# Patient Record
Sex: Female | Born: 1970 | Race: White | Hispanic: No | Marital: Married | Smoking: Current every day smoker
Health system: Southern US, Community
[De-identification: ages and names within clinical notes are randomized; demographics above are authoritative.]

## PROBLEM LIST (undated history)

## (undated) DIAGNOSIS — R002 Palpitations: Secondary | ICD-10-CM

## (undated) HISTORY — PX: ABDOMINAL HYSTERECTOMY: SHX81

---

## 2004-09-10 ENCOUNTER — Emergency Department: Payer: Self-pay | Admitting: Emergency Medicine

## 2004-11-13 ENCOUNTER — Ambulatory Visit: Payer: Self-pay

## 2005-09-21 ENCOUNTER — Emergency Department: Payer: Self-pay | Admitting: Emergency Medicine

## 2005-09-22 ENCOUNTER — Other Ambulatory Visit: Payer: Self-pay

## 2005-09-22 ENCOUNTER — Emergency Department: Payer: Self-pay | Admitting: Emergency Medicine

## 2007-03-01 ENCOUNTER — Ambulatory Visit: Payer: Self-pay | Admitting: Internal Medicine

## 2007-06-02 ENCOUNTER — Emergency Department: Payer: Self-pay | Admitting: Emergency Medicine

## 2007-08-26 ENCOUNTER — Observation Stay: Payer: Self-pay | Admitting: Obstetrics & Gynecology

## 2007-11-20 ENCOUNTER — Observation Stay: Payer: Self-pay

## 2007-11-21 ENCOUNTER — Observation Stay: Payer: Self-pay

## 2007-12-10 ENCOUNTER — Observation Stay: Payer: Self-pay

## 2007-12-14 ENCOUNTER — Inpatient Hospital Stay: Payer: Self-pay

## 2008-01-07 ENCOUNTER — Ambulatory Visit: Payer: Self-pay

## 2008-10-23 ENCOUNTER — Ambulatory Visit: Payer: Self-pay | Admitting: Internal Medicine

## 2009-08-03 ENCOUNTER — Observation Stay: Payer: Self-pay | Admitting: Internal Medicine

## 2010-10-01 ENCOUNTER — Emergency Department: Payer: Self-pay | Admitting: Emergency Medicine

## 2011-02-19 ENCOUNTER — Ambulatory Visit: Payer: Self-pay

## 2011-02-21 ENCOUNTER — Emergency Department: Payer: Self-pay | Admitting: Emergency Medicine

## 2011-02-25 ENCOUNTER — Inpatient Hospital Stay: Payer: Self-pay

## 2011-02-26 LAB — PATHOLOGY REPORT

## 2011-07-19 ENCOUNTER — Inpatient Hospital Stay: Payer: Self-pay | Admitting: *Deleted

## 2011-07-19 LAB — TSH: Thyroid Stimulating Horm: 3.68 u[IU]/mL

## 2011-07-19 LAB — HEPATIC FUNCTION PANEL A (ARMC)
Alkaline Phosphatase: 51 U/L (ref 50–136)
Bilirubin,Total: 0.7 mg/dL (ref 0.2–1.0)
SGOT(AST): 25 U/L (ref 15–37)
SGPT (ALT): 26 U/L
Total Protein: 7.7 g/dL (ref 6.4–8.2)

## 2011-07-19 LAB — CK-MB: CK-MB: 1.9 ng/mL (ref 0.5–3.6)

## 2011-07-19 LAB — CBC WITH DIFFERENTIAL/PLATELET
Basophil #: 0 10*3/uL (ref 0.0–0.1)
Eosinophil %: 2.4 %
HCT: 47.1 % — ABNORMAL HIGH (ref 35.0–47.0)
Lymphocyte #: 2.1 10*3/uL (ref 1.0–3.6)
MCH: 30.1 pg (ref 26.0–34.0)
MCV: 89 fL (ref 80–100)
Monocyte #: 0.7 10*3/uL (ref 0.0–0.7)
Platelet: 295 10*3/uL (ref 150–440)
RBC: 5.32 10*6/uL — ABNORMAL HIGH (ref 3.80–5.20)
RDW: 13.4 % (ref 11.5–14.5)
WBC: 9.8 10*3/uL (ref 3.6–11.0)

## 2011-07-19 LAB — TROPONIN I: Troponin-I: <0.02 ng/mL

## 2011-07-19 LAB — BASIC METABOLIC PANEL
BUN: 24 mg/dL — ABNORMAL HIGH (ref 7–18)
Chloride: 106 mmol/L (ref 98–107)
Creatinine: 0.77 mg/dL (ref 0.60–1.30)
EGFR (African American): >60
Potassium: 4.2 mmol/L (ref 3.5–5.1)
Sodium: 139 mmol/L (ref 136–145)

## 2011-07-19 LAB — URINALYSIS, COMPLETE
Bilirubin,UR: NEGATIVE
Blood: NEGATIVE
Leukocyte Esterase: NEGATIVE
Nitrite: NEGATIVE
Protein: NEGATIVE
RBC,UR: NONE SEEN /HPF (ref 0–5)
Specific Gravity: 1.011 (ref 1.003–1.030)

## 2011-07-20 LAB — CBC WITH DIFFERENTIAL/PLATELET
Basophil %: 0.6 %
Eosinophil #: 0.2 10*3/uL (ref 0.0–0.7)
Eosinophil %: 2.9 %
HCT: 37.7 % (ref 35.0–47.0)
Lymphocyte #: 3.1 10*3/uL (ref 1.0–3.6)
MCH: 29.7 pg (ref 26.0–34.0)
MCHC: 33.4 g/dL (ref 32.0–36.0)
MCV: 89 fL (ref 80–100)
Monocyte #: 0.8 10*3/uL — ABNORMAL HIGH (ref 0.0–0.7)
Monocyte %: 9.5 %
Neutrophil #: 4.1 10*3/uL (ref 1.4–6.5)
Platelet: 263 10*3/uL (ref 150–440)
RBC: 4.25 10*6/uL (ref 3.80–5.20)

## 2011-07-20 LAB — BASIC METABOLIC PANEL
Anion Gap: 9 (ref 7–16)
BUN: 18 mg/dL (ref 7–18)
Chloride: 108 mmol/L — ABNORMAL HIGH (ref 98–107)
Co2: 28 mmol/L (ref 21–32)
Creatinine: 0.64 mg/dL (ref 0.60–1.30)
EGFR (African American): >60
EGFR (Non-African Amer.): >60
Osmolality: 291 (ref 275–301)

## 2011-07-20 LAB — TROPONIN I: Troponin-I: <0.02 ng/mL

## 2014-11-05 NOTE — H&P (Signed)
PATIENT NAME:  Cheyenne Hill, LIBBEY MR#:  [id number] DATE OF BIRTH:  Apr 14, 1971  DATE OF ADMISSION:  07/19/2011  PRIMARY CARE PHYSICIAN: None.   CHIEF COMPLAINT: Passed out, fluttering of the chest, dizziness, nausea.   HISTORY OF PRESENT ILLNESS: A 44 year old female who has history of supraventricular tachycardia in the past, history of arrhythmias. She was last admitted here in January 2011 with supraventricular tachycardia. She had required adenosine in the past. She has last seen Dr. Lady Gary for follow-up in February 2011 when she was started on metoprolol. Today she presented. Last night while she was sleeping she went to the bathroom and after she was coming back she just passed out. She felt a sudden cramping in her abdomen and then she passed out with her nose hitting the floor. She was extremely dizzy, nauseous. She denies any chest pain. She feels palpitations, some fluttering of the chest. She said this is the most severe episode that she had. It usually resolves in a hour but this is persistent so she was brought by EMS. She was in rapid atrial fibrillation. She presented with rapid atrial fibrillation with a rate of 145. She got about 25 mg of IV Cardizem so far but heart rate is still in 130s, 140s. She also got 0.5 mg of IV Ativan. She is still complaining of palpitations. She feels she is extremely dizzy. She feels as if someone has beaten her. She drinks about one diet [facility] every day in the morning. She denies a lot of caffeine intake except the [facility]. She denies any abdominal pain right now. She is complaining of headache and mainly complaining of she feels her nose is broken and mainly complaining of nose pain. She is being admitted for atrial fibrillation with RVR with syncope.   REVIEW OF SYSTEMS: She feels extremely fatigued and tired at this time. No acute change in vision. She is complaining of headache, nose pain. Denies any cough or dyspnea. No painful respiration. No chest  pain but complaining of palpitations. She had a syncopal episode. She is complaining of nausea. No abdominal pain. No GI bleed. No dysuria. No frequency. No thyroid problems. No anemia. No easy bruisability. No bleeding from any site. No rash. No joint pains. No swelling. No focal numbness or weakness. No anxiety, depression.   PAST MEDICAL HISTORY: She has history of supraventricular tachycardia in the past. She was last admitted in January 2011. She usually required Adenocard in the past for supraventricular tachycardia. She says that she had a similar episode that was a minor one in [id number] but did not require an ER visit or admission at that time. She was previously taking metoprolol prescribed by Dr. Lady Gary in 2011 but she is no longer taking any medications. She had an echo done in January 2011 which showed that her echo was normal, ejection fraction of greater than 55%, essentially normal echocardiogram.   PAST SURGICAL HISTORY: She had a hysterectomy recently.   HOME MEDICATIONS: None right now.   ALLERGIES TO MEDICATIONS: None.   SOCIAL HISTORY: She works two jobs. 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 She smokes about a pack would last a few days. Occasional alcohol use. No drug use.   FAMILY HISTORY: Her grandfather had myocardial infarction at the age of 31. Uncle had myocardial infarction at the age of 56, also history of hypertension. As per previous records father had cancer and hypertension.   PHYSICAL EXAMINATION:  VITAL SIGNS: Her vitals when  she presented to the Emergency Room: Temperature 98.1, heart rate 147, respiratory rate 18, blood pressure 100/70, saturating 98% on oxygen. Currently heart rate 144, blood pressure 112/61, saturating 96% on 2 liters nasal cannula.   GENERAL: This is a young Caucasian female, well built, she is in some distress because of nose pain right now.   HEENT: Extraocular muscles are intact. Pupils are equal. No scleral icterus. No  conjunctivitis. Oral mucosa is moist. No pallor. There is obvious deformity on the left side of the left nose.   NECK: No thyroid tenderness, enlargement or nodule. Neck is supple. No masses, nontender. No adenopathy. No JVD. No carotid bruit.   CHEST: Bilateral breath sounds are clear. No wheeze. Normal effort. No respiratory distress.   HEART: Heart sounds are irregular, irregular. No murmur. Good peripheral pulses. No lower extremity edema.   ABDOMEN: Soft, nontender. Normal bowel sounds. No hepatomegaly. No bruit. No masses.   RECTAL: Deferred.   NEUROLOGIC: She is awake, alert, oriented to time, place, and person, slightly lethargic. Cranial nerves are intact. Moving all extremities against gravity.   EXTREMITIES: No cyanosis. No clubbing.   SKIN: No rash, no lesions except the nasal deformity.   LABORATORY, DIAGNOSTIC, AND RADIOLOGICAL DATA: White count 9.8, hemoglobin 16, platelet count 95,000. BMP: Sodium 139, potassium 4.2, BUN 24, creatinine 0.77. Troponin is negative. CK-MB 1.9. LFTs are normal. TSH 3.68. Her CT of the head for syncope basically negative. It says paranasal sinuses and mastoid air cells are negative.   IMPRESSION:  1. Atrial fibrillation with rapid ventricular response.  2. Syncope most likely secondary to rapid atrial fibrillation.  3. History of supraventricular tachycardia in the past.  4. Tobacco abuse.   PLAN: A 44 year old female smoker smoking a pack would last about a couple of days. She presents with syncope with some nasal deformity. She is complaining of palpitations, dizziness, and she is found to be in rapid atrial fibrillation. When she came in her heart rate was like 145, her EKG shows atrial fibrillation. Previously she had SVT but this time she had atrial fibrillation. No acute ischemic changes. She got about 25 mg of IV Cardizem so far. I am going to give her more 10 mg of IV Cardizem and start her on p.o. Cardizem 60 mg q 6 hrs. Her TSH is  normal at this time. Will repeat an echocardiogram, do serial cardiac enzymes on her. Get a cardiology consult on her also. I will also start her on some Lovenox. If her atrial fibrillation remains persistent then she may need Coumadin but her CHADS2 score is low. We can also try beta blocker on her if her heart rate remains high. Will also give her some IV hydration. Will try to talk to the radiologist if the CT scan of the head covered nasolabial also if there is any obvious fracture. Will start her on Norco p.r.n. for nose pain. Plan was discussed with the patient and husband in detail.   TIME SPENT WITH ADMISSION AND COORDINATION: Around 60 minutes.   ____________________________ Fredia SorrowAbhinav Arlene Genova, MD ag:cms D: 07/19/2011 [id number]:10:[id number] ET T: 07/19/2011 10:01:02 ET  JOB#: [id number] cc: Fredia SorrowAbhinav Liviah Cake, MD, <Dictator> Fredia SorrowABHINAV Prashant Glosser MD ELECTRONICALLY SIGNED 08/01/2011 12:29

## 2014-11-05 NOTE — Consult Note (Signed)
    General Aspect 44 yo female with history of intermitant afib who was admitted after suffering a syncopal episode. She has bouts of afib approximately every 2 years. SHe has done well recently and has been off of all meds. SHe states she was in her usual state of health. She got up to use the bathroom at night and while returning to her bed, sufferred a severe cramp in her loer abdomen followed by a syncopal episode. She struck her face. Head ct revealed no intercranial events and no fractures. She was noted to be in afib with rvr on arrival to the er. She converted to sinus rhythm with cardizem and remains in sinus rhythm. She has ruled out fro an mi and ekg is now normal. Echo was normal with no signficant abnormality.   Physical Exam:   GEN WD, WN, NAD    HEENT PERRL    NECK supple    RESP clear BS    CARD Regular rate and rhythm  Murmur    Murmur Systolic    Systolic Murmur axilla    ABD denies tenderness  normal BS  no Abdominal Bruits  no Adominal Mass    LYMPH negative neck    EXTR negative cyanosis/clubbing, negative edema    SKIN normal to palpation    NEURO cranial nerves intact, motor/sensory function intact    PSYCH A+O to time, place, person   Review of Systems:   Subjective/Chief Complaint syncope    General: No Complaints    Skin: No Complaints    ENT: No Complaints    Eyes: No Complaints    Neck: No Complaints    Respiratory: No Complaints    Cardiovascular: No Complaints    Gastrointestinal: No Complaints    Genitourinary: No Complaints    Vascular: No Complaints    Musculoskeletal: Muscle or joint pain    Neurologic: Fainting    Hematologic: No Complaints    Endocrine: No Complaints    Psychiatric: No Complaints    Review of Systems: All other systems were reviewed and found to be negative    Medications/Allergies Reviewed Medications/Allergies reviewed   Home Medications:  alprazolam 0.25 mg oral tablet: 1 tab(s) orally every 8  hours, As Needed for stress/ anxiety, Active  EKG:   Interpretation afib with rvr concerted to sinus rhythm    No Known Allergies:     Impression 44 yo female with history of intermitant afib now admitted after a syncopal episode that occurred in the face of a severe abndominal cramp. She was in afib with rvr on arrival but has converted to sinus rhythm. Etiology of syncope is likely vaso vagal. Afib with rvr may also have played a role but the patient is usually symptomatic when she has the afib and felt none of it prior to the event.    Plan 1. Place on cardizem cd 120 mg daily 2. Ambulate and if stable consider discharge to home with holter monitor for 24 hours. 3. Follow up with Dr. Lady GaryFath in one week.   Electronic Signatures: Dalia HeadingFath, Sharline Lehane A (MD)  (Signed 06-Jan-13 12:37)  Authored: General Aspect/Present Illness, History and Physical Exam, Review of System, Home Medications, EKG , Allergies, Impression/Plan   Last Updated: 06-Jan-13 12:37 by Dalia HeadingFath, Leobardo Granlund A (MD)

## 2014-11-05 NOTE — Discharge Summary (Signed)
PATIENT NAME:  Cheyenne Hill, Cheyenne Hill MR#:  [id number] DATE OF BIRTH:  1971-04-13  DATE OF ADMISSION:  07/19/2011 DATE OF DISCHARGE:  07/20/2011  ADMITTING PHYSICIAN:  Fredia Sorrow, MD  DISCHARGING PHYSICIAN: Enid Baas, MD,  PRIMARY CARE PHYSICIAN: None.   CONSULTANTS IN THE HOSPITAL: Cardiology consultation by Dr. Lady Gary.  DISCHARGE DIAGNOSES: 1. Syncope and dizziness. 2. Atrial fibrillation with rapid ventricular rate. 3. History of paroxysmal atrial fibrillation and supraventricular tachycardia. 4. Tobacco use disorder. 5. Anxiety.  DISCHARGE MEDICATIONS: 1. Aspirin 81 mg p.o. daily. 2. Cardizem 240 mg p.o. daily. 3. Xanax 0.25 mg every eight hours as needed  DISCHARGE DIET: Low-sodium diet.Marland Kitchen   DISCHARGE ACTIVITY: As tolerated.    FOLLOWUP INSTRUCTIONS:   1. Advised to follow up with Dr. Lady Gary in 1 week. 2. PCP followup in 2 weeks. 3. The patient was discharged on a Holter monitor.  4. Smoking cessation.  LABS AT TIME OF DISCHARGE: 1. WBC 8.3, hemoglobin 12.6, hematocrit 37.7, platelet count 263,000. 2. Sodium 147, potassium 3.9, chloride 108, bicarbonate 18, BUN 8, creatinine 0.64, glucose 114, and calcium 7.8. Troponin 3 sets have remained negative in the hospital. 3. Urinalysis is negative.  4. Chest x-ray showing clear lung fields. No acute cardiopulmonary disease. 5. CT of the head showing no acute intracranial abnormality. No hemorrhage, focal mass or mass effect or midline shift. 6. ALT 26, AST 24, alkaline phosphatase 51, total bilirubin 0.7, albumin 4.5. TSH 3.68.  7. Echo Doppler showing LV systolic function is normal. Mild tricuspid regurgitation, right  ventricle is grossly normal in size. Left ventricular ejection fraction is 55%. No valvular abnormalities noted. Right atrium is mildly dilated.   BRIEF HOSPITAL COURSE: Cheyenne Hill is a 44 year old female with a known history of paroxysmal arrhythmias/atrial fibrillation/supraventricular tachycardia, according  to the patient at prior to admission almost greater than a year ago comes to the hospital after she had a syncopal episode. The patient states whenever she goes into this arrhythmic rhythm, she could feel a few days prior but she never had a syncopal episode in the past before. When  she came into the ER, she was in atrial fibrillation with rapid rate in 140s. She was given of 25 mg of IV Cardizem and then was started on p.o. Cardizem. She was admitted to telemetry. After Cardizem IV push she converted into normal sinus rhythm. She was discharged on metoprolol more than a year ago and was discussed to about other treatments including ablation. She took metoprolol for almost six months and then stopped and did okay off the medications. She does smoke but denies lot of caffeine intake. She has not had further syncopal episodes while in the hospital and she converted back to normal sinus rhythm. Echo looked okay with normal ejection fraction. No left atrial dilatation. Her CHADs score is very low so she was only started on aspirin and also she converted back to normal rhythm anyway. Seen by cardiologist Dr. Lady Gary who advised Holter  monitor at the time of discharge. The patient is being discharged with a 24-hour Holter monitor and we will return  in to see Dr. Lady Gary in the office. Her stay has been otherwise uneventful in the hospital.   DISCHARGE CONDITION: Stable.   DISCHARGE DISPOSITION: Home.         Time Spent ON discharge: 40 minutes.    ____________________________ Enid Baas, MD rk:ljs D: 07/21/2011 07:06:47 ET T: 07/22/2011 12:45:00 ET JOB#: [id number]  cc: Enid Baas, MD, <Dictator> Cheyenne Hill. Fath,  MD Enid BaasADHIKA Kayde Warehime MD ELECTRONICALLY SIGNED 07/28/2011 11:56

## 2015-10-29 ENCOUNTER — Emergency Department
Admission: EM | Admit: 2015-10-29 | Discharge: 2015-10-29 | Disposition: A | Discharge status: Home / Self Care | Payer: Self-pay | Attending: Student | Admitting: Student

## 2015-10-29 ENCOUNTER — Emergency Department: Payer: Self-pay

## 2015-10-29 ENCOUNTER — Encounter: Payer: Self-pay | Admitting: Emergency Medicine

## 2015-10-29 DIAGNOSIS — J209 Acute bronchitis, unspecified: Secondary | ICD-10-CM | POA: Insufficient documentation

## 2015-10-29 DIAGNOSIS — R079 Chest pain, unspecified: Secondary | ICD-10-CM

## 2015-10-29 DIAGNOSIS — F17200 Nicotine dependence, unspecified, uncomplicated: Secondary | ICD-10-CM | POA: Insufficient documentation

## 2015-10-29 DIAGNOSIS — J40 Bronchitis, not specified as acute or chronic: Secondary | ICD-10-CM

## 2015-10-29 HISTORY — DX: Palpitations: R00.2

## 2015-10-29 LAB — CBC WITH DIFFERENTIAL/PLATELET
BASOS PCT: 0 %
Basophils Absolute: 0 10*3/uL (ref 0–0.1)
EOS ABS: 0.2 10*3/uL (ref 0–0.7)
Eosinophils Relative: 2 %
HEMATOCRIT: 42.3 % (ref 35.0–47.0)
Hemoglobin: 14.4 g/dL (ref 12.0–16.0)
LYMPHS ABS: 1.8 10*3/uL (ref 1.0–3.6)
Lymphocytes Relative: 14 %
MCH: 29.3 pg (ref 26.0–34.0)
MCHC: 34 g/dL (ref 32.0–36.0)
MCV: 86.2 fL (ref 80.0–100.0)
MONOS PCT: 6 %
Monocytes Absolute: 0.8 10*3/uL (ref 0.2–0.9)
NEUTROS ABS: 10.2 10*3/uL — AB (ref 1.4–6.5)
NEUTROS PCT: 78 %
Platelets: 255 10*3/uL (ref 150–440)
RBC: 4.9 MIL/uL (ref 3.80–5.20)
RDW: 13.3 % (ref 11.5–14.5)
WBC: 13.1 10*3/uL — AB (ref 3.6–11.0)

## 2015-10-29 LAB — TROPONIN I
TROPONIN I: 0.03 ng/mL (ref <0.031)
Troponin I: <0.03 ng/mL (ref <0.031)

## 2015-10-29 LAB — BASIC METABOLIC PANEL
ANION GAP: 8 (ref 5–15)
BUN: 19 mg/dL (ref 6–20)
CALCIUM: 8.9 mg/dL (ref 8.9–10.3)
CO2: 20 mmol/L — AB (ref 22–32)
CREATININE: 0.65 mg/dL (ref 0.44–1.00)
Chloride: 110 mmol/L (ref 101–111)
GFR calc Af Amer: >60 mL/min (ref >60)
GFR calc non Af Amer: >60 mL/min (ref >60)
GLUCOSE: 114 mg/dL — AB (ref 65–99)
Potassium: 3.1 mmol/L — ABNORMAL LOW (ref 3.5–5.1)
Sodium: 138 mmol/L (ref 135–145)

## 2015-10-29 MED ORDER — OXYCODONE HCL 5 MG PO TABS
5.0000 mg | ORAL_TABLET | Freq: Once | ORAL | Status: AC
  Administered 2015-10-29: 5 mg via ORAL
  Filled 2015-10-29: qty 1

## 2015-10-29 MED ORDER — ALBUTEROL SULFATE HFA 108 (90 BASE) MCG/ACT IN AERS: 2.0000 | INHALATION_SPRAY | Freq: Four times a day (QID) | RESPIRATORY_TRACT | Status: DC | PRN

## 2015-10-29 MED ORDER — IBUPROFEN 600 MG PO TABS
600.0000 mg | ORAL_TABLET | Freq: Once | ORAL | Status: AC
  Administered 2015-10-29: 600 mg via ORAL
  Filled 2015-10-29: qty 1

## 2015-10-29 NOTE — ED Provider Notes (Addendum)
[facility] Emergency Department Provider Note  ____________________________________________  Time seen: Approximately 9:05 AM  I have reviewed the triage vital signs and the nursing notes.   HISTORY  Chief Complaint Chest Pain    HPI Cheyenne Hill is a 45 y.o. female history of paroxysmal atrial fibrillation and SVT who presents for evaluation of chest pain this morning which began at approximately 7:30 AM while she was driving her car, gradual onset, significantly improved at this time, no modifying factors. The patient reports that she was driving her kids to school this morning when she felt sudden pain in her sternal chest area. She describes it as "somebody twisting my heart". She reports that she also had some right arm pain and was feeling short of breath. She was concerned that she might have been in SVT or rapid atrial fibrillation again though she does not know how fast her heart rate was. She reports her symptoms spontaneously improved significantly on arrival to the emergency department. She has had productive cough for several weeks though she denies any hemoptysis or fever. She has had runny nose and nasal congestion as well as intermittent sore throat. No abdominal pain, vomiting or diarrhea. Pain was not ripping and tearing in nature, did not radiate to the back or down towards the feet. She denies any personal history of PE or DVT, no recent surgeries or prolonged period of immobilization, no exogenous estrogen use. She denies any personal history of ACS. She does report that she had an uncle who died of a heart attack when he was "like 35".   Past Medical History  Diagnosis Date  . Palpitations     There are no active problems to display for this patient.   History reviewed. No pertinent past surgical history.  No current outpatient prescriptions on file.  Allergies Review of patient's allergies indicates no known allergies.  History  reviewed. No pertinent family history.  Social History Social History  Substance Use Topics  . Smoking status: Current Every Day Smoker  . Smokeless tobacco: None  . Alcohol Use: No    Review of Systems Constitutional: No fever/chills Eyes: No visual changes. ENT: No sore throat. Cardiovascular: +chest pain. Respiratory: +shortness of breath. Gastrointestinal: No abdominal pain.  No nausea, no vomiting.  No diarrhea.  No constipation. Genitourinary: Negative for dysuria. Musculoskeletal: Negative for back pain. Skin: Negative for rash. Neurological: Negative for headaches, focal weakness or numbness.  10-point ROS otherwise negative.  ____________________________________________   PHYSICAL EXAM:  VITAL SIGNS: ED Triage Vitals  Enc Vitals Group     BP 10/29/15 0823 117/73 mmHg     Pulse Rate 10/29/15 0823 79     Resp 10/29/15 0834 12     Temp 10/29/15 0823 98 F (36.7 C)     Temp Source 10/29/15 0823 Oral     SpO2 10/29/15 0823 97 %     Weight 10/29/15 0823 230 lb (104.327 kg)     Height 10/29/15 0823 5\' 10"  (1.778 m)     Head Cir --      Peak Flow --      Pain Score 10/29/15 0824 10     Pain Loc --      Pain Edu? --      Excl. in GC? --     Constitutional: Alert and oriented. Well appearing and in no acute distress. Eyes: Conjunctivae are normal. PERRL. EOMI. Head: Atraumatic. Nose: + congestion/rhinnorhea. Mouth/Throat: Mucous membranes are moist.  Oropharynx non-erythematous.  Neck: No stridor.  Supple without meningismus. Cardiovascular: Normal rate, regular rhythm. Grossly normal heart sounds.  Good peripheral circulation. Respiratory: Normal respiratory effort.  No retractions. Lungs CTAB. Gastrointestinal: Soft and nontender. No distention.  No CVA tenderness. Genitourinary: deferred Musculoskeletal: No lower extremity tenderness nor edema.  No joint effusions. Tenderness to palpation throughout the chest wall bilaterally and palpation, worse to the  left and the right of the sternum. No calf swelling/asymmetry/tenderness. Neurologic:  Normal speech and language. No gross focal neurologic deficits are appreciated. No gait instability. Skin:  Skin is warm, dry and intact. No rash noted. Psychiatric: Mood and affect are normal. Speech and behavior are normal.  ____________________________________________   LABS (all labs ordered are listed, but only abnormal results are displayed)  Labs Reviewed  CBC WITH DIFFERENTIAL/PLATELET - Abnormal; Notable for the following:    WBC 13.1 (*)    Neutro Abs 10.2 (*)    All other components within normal limits  BASIC METABOLIC PANEL - Abnormal; Notable for the following:    Potassium 3.1 (*)    CO2 20 (*)    Glucose, Bld 114 (*)    All other components within normal limits  TROPONIN I  TROPONIN I   ____________________________________________  EKG  ED ECG REPORT I, Gayla Doss, the attending physician, personally viewed and interpreted this ECG.   Date: 10/29/2015  EKG Time: 08:08  Rate: 85  Rhythm: normal sinus rhythm  Axis: normal  Intervals:none  ST&T Change: No acute ST elevation.  ____________________________________________  RADIOLOGY  CXR IMPRESSION: Negative chest. ____________________________________________   PROCEDURES  Procedure(s) performed: None  Critical Care performed: No  ____________________________________________   INITIAL IMPRESSION / ASSESSMENT AND PLAN / ED COURSE  Pertinent labs & imaging results that were available during my care of the patient were reviewed by me and considered in my medical decision making (see chart for details).  Cheyenne Hill is a 45 y.o. female history of paroxysmal atrial fibrillation and SVT who presents for evaluation of chest pain this morning which began at approximately 7:30 AM while she was driving her car. On exam, she is very well-appearing and in no acute distress. Vital signs are stable, she is  afebrile. EKG reassuring, not consistent with acute ischemia. She does have reproducible tenderness to palpation throughout the chest wall. EKG shows normal sinus rhythm, no A. fib or SVT. PERC negative, doubt PE. Not consistent with acute aortic dissection. We'll obtain 2 sets of cardiac enzymes given her history of early coronary artery disease in an uncle but I suspect that her pain today may be muscular skeletal in nature and related to bronchitis. We'll treat her pain. Reassess for disposition. Observe briefly on cardiac monitor.  ----------------------------------------- 12:18 PM on 10/29/2015 ----------------------------------------- Patient was significant amount of her pain at this time. She is observed on cardiac monitor, remained in normal sinus rhythm after 4 hours of observation. 2 sets of cardiac enzymes are negative, doubt ACS. CBC with leukocytosis, white blood cell count is 13.1. BMP with mild hypokalemia, potassium 3.1. Chest x-ray clear. Discussed that given her prolonged cough, she may have musculoskeletal pain/rib strain related to bronchitis. We'll discharge with an albuterol MDI though I discussed with her that it's possible she may have had a run of SVT or A. fib that resolved spontaneously. She will follow-up with cardiology per our discussion. We discussed return precautions and she is comfortable with the discharge plan. DC home. ____________________________________________   FINAL CLINICAL IMPRESSION(S) / ED DIAGNOSES  Final diagnoses:  Chest pain, unspecified chest pain type  Bronchitis      Gayla Doss, MD 10/29/15 1610  Gayla Doss, MD 10/29/15 1219

## 2015-10-29 NOTE — ED Notes (Signed)
Pt sleeping in bed, call bell in reach

## 2015-10-29 NOTE — ED Notes (Signed)
Pt with chest pain, palpatations that started about 0730. Pain was pressure left of mid chest that radiated to right shoulder.

## 2016-02-20 ENCOUNTER — Ambulatory Visit: Payer: Self-pay

## 2019-12-13 ENCOUNTER — Ambulatory Visit
Admission: RE | Admit: 2019-12-13 | Discharge: 2019-12-13 | Disposition: A | Discharge status: Home / Self Care | Payer: Self-pay | Source: Ambulatory Visit | Attending: Oncology | Admitting: Oncology

## 2019-12-13 ENCOUNTER — Ambulatory Visit: Payer: Self-pay | Attending: Oncology

## 2019-12-13 ENCOUNTER — Other Ambulatory Visit: Payer: Self-pay

## 2019-12-13 VITALS — BP 120/83 | HR 74 | Temp 98.1°F | Ht 68.0 in | Wt 215.7 lb

## 2019-12-13 DIAGNOSIS — N63 Unspecified lump in unspecified breast: Secondary | ICD-10-CM

## 2019-12-13 DIAGNOSIS — Z0000 Encounter for general adult medical examination without abnormal findings: Secondary | ICD-10-CM | POA: Insufficient documentation

## 2019-12-13 NOTE — Progress Notes (Unsigned)
Radiologist reviewed Birads 2 findings with patient. Pap results pending. Phoned patient with Negative/negative pap results.  Copy to [facility].

## 2019-12-13 NOTE — Progress Notes (Signed)
°  Subjective:     Patient ID: Cheyenne Hill, female   DOB: 05/26/1971, 49 y.o.   MRN: [id number]  HPI   Review of Systems     Objective:   Physical Exam Chest:     Breasts:        Right: No swelling, bleeding, inverted nipple, mass, nipple discharge, skin change or tenderness.        Left: Mass present. No swelling, bleeding, inverted nipple, nipple discharge, skin change or tenderness.       Comments:   Left breast mobile mass, palpable when patient sitting up       Assessment:     49 year old patient presents for [facility] clinic visit. No previous mammogram.  Complains of left breast mass x 1 month.  Reports 40 lb. weight loss on [place]. Patient screened, and meets BCCCP eligibility.  Patient does not have insurance, Medicare or Medicaid. Instructed patient on breast self awareness using teach back method.  Palpated mass upper inner chest left breast.  Unable to palpate when patient is supine. Patient reports she noticed about a month ago.  Risk Assessment    Risk Scores      12/13/2019   Last edited by: Alta Corning, CMA   5-year risk: 1 %   Lifetime risk: 10.2 %            Plan:     Sent for bilateral diagnostic mammogram, and ultrasound.

## 2019-12-17 LAB — IGP, APTIMA HPV: HPV Aptima: NEGATIVE

## 2020-05-25 ENCOUNTER — Emergency Department: Payer: 59

## 2020-05-25 ENCOUNTER — Inpatient Hospital Stay
Admission: EM | Admit: 2020-05-25 | Discharge: 2020-05-27 | DRG: 390 | Disposition: A | Discharge status: Home / Self Care | Payer: 59 | Attending: Internal Medicine | Admitting: Internal Medicine

## 2020-05-25 ENCOUNTER — Encounter: Payer: Self-pay | Admitting: Emergency Medicine

## 2020-05-25 ENCOUNTER — Other Ambulatory Visit: Payer: Self-pay

## 2020-05-25 DIAGNOSIS — R1031 Right lower quadrant pain: Secondary | ICD-10-CM | POA: Diagnosis present

## 2020-05-25 DIAGNOSIS — F17213 Nicotine dependence, cigarettes, with withdrawal: Secondary | ICD-10-CM

## 2020-05-25 DIAGNOSIS — Z79899 Other long term (current) drug therapy: Secondary | ICD-10-CM | POA: Diagnosis not present

## 2020-05-25 DIAGNOSIS — Z8249 Family history of ischemic heart disease and other diseases of the circulatory system: Secondary | ICD-10-CM

## 2020-05-25 DIAGNOSIS — Z20822 Contact with and (suspected) exposure to covid-19: Secondary | ICD-10-CM | POA: Diagnosis present

## 2020-05-25 DIAGNOSIS — K56609 Unspecified intestinal obstruction, unspecified as to partial versus complete obstruction: Secondary | ICD-10-CM

## 2020-05-25 DIAGNOSIS — K56600 Partial intestinal obstruction, unspecified as to cause: Secondary | ICD-10-CM | POA: Diagnosis present

## 2020-05-25 DIAGNOSIS — F17200 Nicotine dependence, unspecified, uncomplicated: Secondary | ICD-10-CM | POA: Diagnosis present

## 2020-05-25 HISTORY — DX: Unspecified intestinal obstruction, unspecified as to partial versus complete obstruction: K56.609

## 2020-05-25 LAB — COMPREHENSIVE METABOLIC PANEL
ALT: 15 U/L (ref 0–44)
AST: 14 U/L — ABNORMAL LOW (ref 15–41)
Albumin: 4.4 g/dL (ref 3.5–5.0)
Alkaline Phosphatase: 40 U/L (ref 38–126)
Anion gap: 8 (ref 5–15)
BUN: 17 mg/dL (ref 6–20)
CO2: 25 mmol/L (ref 22–32)
Calcium: 9.3 mg/dL (ref 8.9–10.3)
Chloride: 103 mmol/L (ref 98–111)
Creatinine, Ser: 0.53 mg/dL (ref 0.44–1.00)
GFR, Estimated: >60 mL/min (ref >60)
Glucose, Bld: 143 mg/dL — ABNORMAL HIGH (ref 70–99)
Potassium: 3.7 mmol/L (ref 3.5–5.1)
Sodium: 136 mmol/L (ref 135–145)
Total Bilirubin: 0.9 mg/dL (ref 0.3–1.2)
Total Protein: 7.2 g/dL (ref 6.5–8.1)

## 2020-05-25 LAB — URINALYSIS, COMPLETE (UACMP) WITH MICROSCOPIC
Bilirubin Urine: NEGATIVE
Glucose, UA: NEGATIVE mg/dL
Hgb urine dipstick: NEGATIVE
Ketones, ur: NEGATIVE mg/dL
Leukocytes,Ua: NEGATIVE
Nitrite: NEGATIVE
Protein, ur: NEGATIVE mg/dL
Specific Gravity, Urine: 1.017 (ref 1.005–1.030)
pH: 7 (ref 5.0–8.0)

## 2020-05-25 LAB — RESPIRATORY PANEL BY RT PCR (FLU A&B, COVID)
Influenza A by PCR: NEGATIVE
Influenza B by PCR: NEGATIVE
SARS Coronavirus 2 by RT PCR: NEGATIVE

## 2020-05-25 LAB — CBC
HCT: 42.7 % (ref 36.0–46.0)
Hemoglobin: 14.6 g/dL (ref 12.0–15.0)
MCH: 30.7 pg (ref 26.0–34.0)
MCHC: 34.2 g/dL (ref 30.0–36.0)
MCV: 89.7 fL (ref 80.0–100.0)
Platelets: 275 10*3/uL (ref 150–400)
RBC: 4.76 MIL/uL (ref 3.87–5.11)
RDW: 12.7 % (ref 11.5–15.5)
WBC: 10.6 10*3/uL — ABNORMAL HIGH (ref 4.0–10.5)
nRBC: 0 % (ref 0.0–0.2)

## 2020-05-25 LAB — LIPASE, BLOOD: Lipase: 24 U/L (ref 11–51)

## 2020-05-25 LAB — POC URINE PREG, ED: Preg Test, Ur: NEGATIVE

## 2020-05-25 MED ORDER — DIPHENHYDRAMINE HCL 50 MG/ML IJ SOLN
25.0000 mg | Freq: Four times a day (QID) | INTRAMUSCULAR | Status: DC | PRN
  Administered 2020-05-26: 25 mg via INTRAVENOUS
  Filled 2020-05-25: qty 1

## 2020-05-25 MED ORDER — MORPHINE SULFATE (PF) 4 MG/ML IV SOLN
4.0000 mg | Freq: Once | INTRAVENOUS | Status: AC
  Administered 2020-05-25: 4 mg via INTRAVENOUS
  Filled 2020-05-25: qty 1

## 2020-05-25 MED ORDER — HYDROMORPHONE HCL 1 MG/ML IJ SOLN
1.0000 mg | INTRAMUSCULAR | Status: DC | PRN
  Administered 2020-05-25 – 2020-05-26 (×3): 1 mg via INTRAVENOUS
  Filled 2020-05-25 (×3): qty 1

## 2020-05-25 MED ORDER — ONDANSETRON HCL 4 MG PO TABS: 4.0000 mg | ORAL_TABLET | Freq: Four times a day (QID) | ORAL | Status: DC | PRN

## 2020-05-25 MED ORDER — MELATONIN 5 MG PO TABS
5.0000 mg | ORAL_TABLET | Freq: Every day | ORAL | Status: DC
  Filled 2020-05-25: qty 1

## 2020-05-25 MED ORDER — ONDANSETRON HCL 4 MG/2ML IJ SOLN
4.0000 mg | Freq: Four times a day (QID) | INTRAMUSCULAR | Status: DC | PRN
  Administered 2020-05-26: 4 mg via INTRAVENOUS
  Filled 2020-05-25: qty 2

## 2020-05-25 MED ORDER — ONDANSETRON 4 MG PO TBDP: 4.0000 mg | ORAL_TABLET | Freq: Once | ORAL | Status: DC

## 2020-05-25 MED ORDER — ENOXAPARIN SODIUM 40 MG/0.4ML ~~LOC~~ SOLN
40.0000 mg | SUBCUTANEOUS | Status: DC
  Administered 2020-05-26: 40 mg via SUBCUTANEOUS
  Filled 2020-05-25: qty 0.4

## 2020-05-25 MED ORDER — SODIUM CHLORIDE 0.9 % IV SOLN: INTRAVENOUS | Status: DC

## 2020-05-25 MED ORDER — IOHEXOL 300 MG/ML  SOLN
100.0000 mL | Freq: Once | INTRAMUSCULAR | Status: AC | PRN
  Administered 2020-05-25: 100 mL via INTRAVENOUS
  Filled 2020-05-25: qty 100

## 2020-05-25 MED ORDER — MORPHINE SULFATE (PF) 2 MG/ML IV SOLN
2.0000 mg | INTRAVENOUS | Status: DC | PRN
  Administered 2020-05-25: 2 mg via INTRAVENOUS
  Filled 2020-05-25: qty 1

## 2020-05-25 MED ORDER — LORAZEPAM 2 MG/ML IJ SOLN
1.0000 mg | Freq: Once | INTRAMUSCULAR | Status: AC
  Administered 2020-05-25: 1 mg via INTRAVENOUS
  Filled 2020-05-25: qty 1

## 2020-05-25 MED ORDER — SODIUM CHLORIDE 0.9 % IV BOLUS
500.0000 mL | Freq: Once | INTRAVENOUS | Status: AC
  Administered 2020-05-25: 500 mL via INTRAVENOUS

## 2020-05-25 MED ORDER — PANTOPRAZOLE SODIUM 40 MG IV SOLR
40.0000 mg | INTRAVENOUS | Status: DC
  Administered 2020-05-25 – 2020-05-26 (×2): 40 mg via INTRAVENOUS
  Filled 2020-05-25 (×2): qty 40

## 2020-05-25 MED ORDER — ONDANSETRON HCL 4 MG/2ML IJ SOLN
4.0000 mg | Freq: Once | INTRAMUSCULAR | Status: AC
  Administered 2020-05-25: 4 mg via INTRAVENOUS
  Filled 2020-05-25: qty 2

## 2020-05-25 MED ORDER — ALBUTEROL SULFATE HFA 108 (90 BASE) MCG/ACT IN AERS
2.0000 | INHALATION_SPRAY | Freq: Four times a day (QID) | RESPIRATORY_TRACT | Status: DC | PRN
  Filled 2020-05-25: qty 6.7

## 2020-05-25 MED ORDER — PHENOL 1.4 % MT LIQD
1.0000 | OROMUCOSAL | Status: DC | PRN
  Administered 2020-05-25: 1 via OROMUCOSAL
  Filled 2020-05-25 (×2): qty 177

## 2020-05-25 NOTE — H&P (Signed)
History and Physical    Cheyenne Hill PFX:[id number] DOB: 1970/11/12 DOA: 05/25/2020  PCP: Patient, No Pcp Per   Patient coming from: Home  I have personally briefly reviewed patient's old medical records in [facility]  Chief Complaint: Abdominal pain  HPI: Cheyenne Hill is a 49 y.o. female with no significant past medical history who presents for evaluation of abdominal pain which started in the periumbilical/right lower quadrant at about 6 PM the day prior to admission.  She rated the pain as a severe, 10 x 10 intensity, intermittent, sharp pain with radiation to her back associated with nausea and vomiting.  She denies having any relieving or aggravating factors and presented to the ER for evaluation due to persistence of the pain. She has never had pain like this in the past and denies having any fever, no chills, no dizziness, no lightheadedness, no chest pain, no shortness of breath, no urinary symptoms. Her last bowel movement was 1 day prior to her admission and she states that it was normal. Labs show sodium 136, potassium 3.7, chloride 103, bicarb 25, glucose 143, BUN 17, creatinine 0.53, calcium 9.3, phosphatase 40, albumin 4.4, lipase 24 AST 14, ALT 15, total protein 7.2 hematocrit 42.7, MCV 89.7, RDW 12.7, platelet count 275 CT scan of abdomen and pelvis shows low-grade small-bowel obstruction with gradual transition point within the low right pelvis. The distal small bowel at the level of transition is circumferentially thickened with submucosal edema. These findings raise the suspicion for the possibility of underlying inflammatory bowel disease with possible stricture. Alternatively, obstruction could be secondary to adhesive etiology given history of prior pelvic surgery.Small volume ascites, likely reactive.    ED Course: Patient is a 49 year old female with a prior history of hysterectomy over 10 years ago who presents to the ER for condition of sudden onset, sharp  pain started in the periumbilical area/right lower quadrant associated with nausea and vomiting.  Patient has been unable to tolerate p.o. intake.  CT scan of abdomen and pelvis shows a low-grade small bowel obstruction.  Review of Systems: As per HPI otherwise 10 point review of systems negative.    Past Medical History:  Diagnosis Date  . Palpitations     Past Surgical History:  Procedure Laterality Date  . ABDOMINAL HYSTERECTOMY       reports that she has been smoking. She has never used smokeless tobacco. She reports current alcohol use. She reports previous drug use.  No Known Allergies  No family history on file.   Prior to Admission medications   Medication Sig Start Date End Date Taking? Authorizing Provider  albuterol (PROVENTIL HFA;VENTOLIN HFA) 108 (90 Base) MCG/ACT inhaler Inhale 2 puffs into the lungs every 6 (six) hours as needed for wheezing or shortness of breath. Patient not taking: Reported on 05/25/2020 10/29/15   Gayla Doss, MD    Physical Exam: Vitals:   05/25/20 1315 05/25/20 1330 05/25/20 1345 05/25/20 1600  BP:      Pulse: 62 (!) 50 (!) 56 82  Resp:    18  Temp:    98.8 F (37.1 C)  TempSrc:    Oral  SpO2: 98% 99% 100%   Weight:      Height:         Vitals:   05/25/20 1315 05/25/20 1330 05/25/20 1345 05/25/20 1600  BP:      Pulse: 62 (!) 50 (!) 56 82  Resp:    18  Temp:  98.8 F (37.1 C)  TempSrc:    Oral  SpO2: 98% 99% 100%   Weight:      Height:        Constitutional: NAD, alert and oriented x 3.  Appears uncomfortable Eyes: PERRL, lids and conjunctivae normal ENMT: Mucous membranes are moist.  Neck: normal, supple, no masses, no thyromegaly Respiratory: clear to auscultation bilaterally, no wheezing, no crackles. Normal respiratory effort. No accessory muscle use.  Cardiovascular: Regular rate and rhythm, no murmurs / rubs / gallops. No extremity edema. 2+ pedal pulses. No carotid bruits.  Abdomen: Periumbilical/right lower  quadrant tenderness, no masses palpated. No hepatosplenomegaly. Bowel sounds hypoactive.  Musculoskeletal: no clubbing / cyanosis. No joint deformity upper and lower extremities.  Skin: no rashes, lesions, ulcers.  Neurologic: No gross focal neurologic deficit. Psychiatric: Normal mood and affect.   Labs on Admission: I have personally reviewed following labs and imaging studies  CBC: Recent Labs  Lab 05/25/20 1051  WBC 10.6*  HGB 14.6  HCT 42.7  MCV 89.7  PLT 275   Basic Metabolic Panel: Recent Labs  Lab 05/25/20 1051  NA 136  K 3.7  CL 103  CO2 25  GLUCOSE 143*  BUN 17  CREATININE 0.53  CALCIUM 9.3   GFR: Estimated Creatinine Clearance: 101.4 mL/min (by C-G formula based on SCr of 0.53 mg/dL). Liver Function Tests: Recent Labs  Lab 05/25/20 1051  AST 14*  ALT 15  ALKPHOS 40  BILITOT 0.9  PROT 7.2  ALBUMIN 4.4   Recent Labs  Lab 05/25/20 1051  LIPASE 24   No results for input(s): AMMONIA in the last 168 hours. Coagulation Profile: No results for input(s): INR, PROTIME in the last 168 hours. Cardiac Enzymes: No results for input(s): CKTOTAL, CKMB, CKMBINDEX, TROPONINI in the last 168 hours. BNP (last 3 results) No results for input(s): PROBNP in the last 8760 hours. HbA1C: No results for input(s): HGBA1C in the last 72 hours. CBG: No results for input(s): GLUCAP in the last 168 hours. Lipid Profile: No results for input(s): CHOL, HDL, LDLCALC, TRIG, CHOLHDL, LDLDIRECT in the last 72 hours. Thyroid Function Tests: No results for input(s): TSH, T4TOTAL, FREET4, T3FREE, THYROIDAB in the last 72 hours. Anemia Panel: No results for input(s): VITAMINB12, FOLATE, FERRITIN, TIBC, IRON, RETICCTPCT in the last 72 hours. Urine analysis:    Component Value Date/Time   COLORURINE YELLOW (A) 05/25/2020 1051   APPEARANCEUR CLOUDY (A) 05/25/2020 1051   APPEARANCEUR Clear 07/19/2011 0900   LABSPEC 1.017 05/25/2020 1051   LABSPEC 1.011 07/19/2011 0900    PHURINE 7.0 05/25/2020 1051   GLUCOSEU NEGATIVE 05/25/2020 1051   GLUCOSEU Negative 07/19/2011 0900   HGBUR NEGATIVE 05/25/2020 1051   BILIRUBINUR NEGATIVE 05/25/2020 1051   BILIRUBINUR Negative 07/19/2011 0900   KETONESUR NEGATIVE 05/25/2020 1051   PROTEINUR NEGATIVE 05/25/2020 1051   NITRITE NEGATIVE 05/25/2020 1051   LEUKOCYTESUR NEGATIVE 05/25/2020 1051   LEUKOCYTESUR Negative 07/19/2011 0900    Radiological Exams on Admission: CT ABDOMEN PELVIS W CONTRAST  Result Date: 05/25/2020 CLINICAL DATA:  Right-sided abdominal pain EXAM: CT ABDOMEN AND PELVIS WITH CONTRAST TECHNIQUE: Multidetector CT imaging of the abdomen and pelvis was performed using the standard protocol following bolus administration of intravenous contrast. CONTRAST:  OMNIPAQUE IOHEXOL 300 MG/ML  SOLN COMPARISON:  11/13/2004 FINDINGS: Lower chest: Included lung bases are clear.  Heart size is normal. Hepatobiliary: No focal liver abnormality is seen. No gallstones, gallbladder wall thickening, or biliary dilatation. Pancreas: Unremarkable. No pancreatic ductal dilatation  or surrounding inflammatory changes. Spleen: Normal in size without focal abnormality. Adrenals/Urinary Tract: Unremarkable adrenal glands. Kidneys enhance symmetrically without focal lesion, stone, or hydronephrosis. Ureters are nondilated. Urinary bladder appears unremarkable. Stomach/Bowel: There are numerous dilated, fluid-filled loops of small bowel throughout the abdomen measuring up to 3.6 cm in diameter. Somewhat gradual transition point to decompressed bowel within the low right pelvis (series 2, images 72-73. The small bowel within this region appears circumferentially thickened with submucosal edema. No pneumatosis is identified. There is mesenteric edema and fluid. A air-filled, noninflamed appendix is identified in the right lower quadrant (series 2, image 70). No focal colonic thickening or evidence of colonic inflammation. The stomach is within  normal limits. Vascular/Lymphatic: No significant vascular findings are present. No enlarged abdominal or pelvic lymph nodes. Reproductive: Interval partial hysterectomy. 1.5 cm peripherally enhancing right adnexal cyst or follicle. No left adnexal abnormality. Other: Small volume ascites. Central mesenteric edema. No organized abdominopelvic fluid collection. No pneumoperitoneum. No abdominal wall hernia. Musculoskeletal: No acute or significant osseous findings. IMPRESSION: 1. Low-grade small-bowel obstruction with gradual transition point within the low right pelvis. The distal small bowel at the level of transition is circumferentially thickened with submucosal edema. These findings raise the suspicion for the possibility of underlying inflammatory bowel disease with possible stricture. Alternatively, obstruction could be secondary to adhesive etiology given history of prior pelvic surgery. 2. Small volume ascites, likely reactive. These results were called by telephone at the time of interpretation on 05/25/2020 at 2:24 pm to provider Jene Every , who verbally acknowledged these results. Electronically Signed   By: Duanne Guess D.O.   On: 05/25/2020 14:27    EKG: Independently reviewed.   Assessment/Plan Principal Problem:   SBO (small bowel obstruction) (HCC) Active Problems:   Nicotine dependence    Small bowel obstruction Most likely secondary to adhesions from prior hysterectomy Conservative management for now Gastric decompression with NG tube Supportive care with IV fluid hydration, pain control and IV PPI We will consult surgery   Nicotine dependence Smoking cessation was discussed with patient in detail She declines a nicotine transdermal patch at this time  DVT prophylaxis: Lovenox Code Status: Full code Family Communication: Greater than 50% of time was spent discussing patient's condition and plan of care with her and her husband at the bedside.  All questions and  concerns have been addressed.  They verbalized understanding and agree with the plan. Disposition Plan: Back to previous home environment Consults called: Surgery    Lillian Tigges MD [facility]     05/25/2020, 4:41 PM

## 2020-05-25 NOTE — ED Provider Notes (Signed)
[facility] Emergency Department Provider Note   ____________________________________________    I have reviewed the triage vital signs and the nursing notes.   HISTORY  Chief Complaint Abdominal Pain     HPI Cheyenne Hill is a 49 y.o. female who presents with complaints of abdominal pain, primarily in the right lower quadrant.  She reports that started yesterday evening.  She complains of significant nausea and vomiting as well, denies diarrhea.  Has a history of a hysterectomy.  No fevers or chills reported.  Has not taken anything for this besides Pepcid with little improvement.  She reports the pain is sharp and cramping in intensity.  Past Medical History:  Diagnosis Date  . Palpitations     There are no problems to display for this patient.   Past Surgical History:  Procedure Laterality Date  . ABDOMINAL HYSTERECTOMY      Prior to Admission medications   Medication Sig Start Date End Date Taking? Authorizing Provider  albuterol (PROVENTIL HFA;VENTOLIN HFA) 108 (90 Base) MCG/ACT inhaler Inhale 2 puffs into the lungs every 6 (six) hours as needed for wheezing or shortness of breath. 10/29/15   Gayla Doss, MD     Allergies Patient has no known allergies.  No family history on file.  Social History Social History   Tobacco Use  . Smoking status: Current Every Day Smoker  . Smokeless tobacco: Never Used  Substance Use Topics  . Alcohol use: Yes    Comment: social  . Drug use: Not Currently    Review of Systems  Constitutional: No fever/chills Eyes: No visual changes.  ENT: No sore throat. Cardiovascular: Denies chest pain. Respiratory: Denies shortness of breath. Gastrointestinal: As above Genitourinary: Negative for dysuria. Musculoskeletal: Negative for back pain. Skin: Negative for rash. Neurological: Negative for headaches or weakness   ____________________________________________   PHYSICAL EXAM:  VITAL  SIGNS: ED Triage Vitals  Enc Vitals Group     BP 05/25/20 1028 117/79     Pulse Rate 05/25/20 1028 86     Resp 05/25/20 1028 16     Temp 05/25/20 1033 98.2 F (36.8 C)     Temp Source 05/25/20 1033 Oral     SpO2 05/25/20 1028 98 %     Weight 05/25/20 1031 83.9 kg (185 lb)     Height 05/25/20 1031 1.778 m (5\' 10" )     Head Circumference --      Peak Flow --      Pain Score 05/25/20 1030 6     Pain Loc --      Pain Edu? --      Excl. in GC? --     Constitutional: Alert and oriented.   Nose: No congestion/rhinnorhea. Mouth/Throat: Mucous membranes are moist.   Neck:  Painless ROM Cardiovascular: Normal rate, regular rhythm. Grossly normal heart sounds.  Good peripheral circulation. Respiratory: Normal respiratory effort.  No retractions. Lungs CTAB. Gastrointestinal: Mild tenderness palpation the right lower quadrant, no right upper quadrant tenderness. No distention.  No CVA tenderness. Musculoskeletal: No lower extremity tenderness nor edema.  Warm and well perfused Neurologic:  Normal speech and language. No gross focal neurologic deficits are appreciated.  Skin:  Skin is warm, dry and intact. No rash noted. Psychiatric: Mood and affect are normal. Speech and behavior are normal.  ____________________________________________   LABS (all labs ordered are listed, but only abnormal results are displayed)  Labs Reviewed  COMPREHENSIVE METABOLIC PANEL - Abnormal; Notable for the  following components:      Result Value   Glucose, Bld 143 (*)    AST 14 (*)    All other components within normal limits  CBC - Abnormal; Notable for the following components:   WBC 10.6 (*)    All other components within normal limits  URINALYSIS, COMPLETE (UACMP) WITH MICROSCOPIC - Abnormal; Notable for the following components:   Color, Urine YELLOW (*)    APPearance CLOUDY (*)    Bacteria, UA RARE (*)    All other components within normal limits  RESPIRATORY PANEL BY RT PCR (FLU A&B, COVID)   LIPASE, BLOOD  POC URINE PREG, ED   ____________________________________________  EKG None ____________________________________________  RADIOLOGY  CT abdomen pelvis ____________________________________________   PROCEDURES  Procedure(s) performed: No  Procedures   Critical Care performed: No ____________________________________________   INITIAL IMPRESSION / ASSESSMENT AND PLAN / ED COURSE  Pertinent labs & imaging results that were available during my care of the patient were reviewed by me and considered in my medical decision making (see chart for details).  Patient presents with abdominal pain as described above, differential includes appendicitis, viral gastritis, colitis/diverticulitis.  Lab work is overall reassuring, mild nonspecific elevation of white blood cell count, urinalysis is unremarkable, no hematuria.  Patient treated with IV morphine, IV Zofran, IV fluids.  Will obtain CT abdomen pelvis  Contacted by radiologist regarding CT abdomen pelvis, consistent with small bowel obstruction with transition point in the pelvis, possible IBD versus adhesions  Discussed with Dr. Tonna Boehringer of surgery, recommends medicine admission     ____________________________________________   FINAL CLINICAL IMPRESSION(S) / ED DIAGNOSES  Final diagnoses:  SBO (small bowel obstruction) (HCC)        Note:  This document was prepared using Dragon voice recognition software and may include unintentional dictation errors.   Jene Every, MD 05/25/20 [phone]

## 2020-05-25 NOTE — Consult Note (Signed)
Subjective:   CC: SBO  HPI:  Cheyenne Hill is a 49 y.o. female who was consulted by Agbata for issue above.  Symptoms were first noted 1 day ago. Pain is sharp, radiating from the periumbilical to RLQ.  Associated with N/V, exacerbated by nothing specific.     Past Medical History:  has a past medical history of Palpitations.  Past Surgical History:  Past Surgical History:  Procedure Laterality Date  . ABDOMINAL HYSTERECTOMY      Family History: family history includes Hypertension in her mother.  Social History:  reports that she has been smoking. She has never used smokeless tobacco. She reports current alcohol use. She reports previous drug use.  Current Medications:  Prior to Admission medications   Medication Sig Start Date End Date Taking? Authorizing Provider  albuterol (PROVENTIL HFA;VENTOLIN HFA) 108 (90 Base) MCG/ACT inhaler Inhale 2 puffs into the lungs every 6 (six) hours as needed for wheezing or shortness of breath. Patient not taking: Reported on 05/25/2020 10/29/15   Gayla Doss, MD    Allergies:  Allergies as of 05/25/2020  . (No Known Allergies)    ROS:  General: Denies weight loss, weight gain, fatigue, fevers, chills, and night sweats. Eyes: Denies blurry vision, double vision, eye pain, itchy eyes, and tearing. Ears: Denies hearing loss, earache, and ringing in ears. Nose: Denies sinus pain, congestion, infections, runny nose, and nosebleeds. Mouth/throat: Denies hoarseness, sore throat, bleeding gums, and difficulty swallowing. Heart: Denies chest pain, palpitations, racing heart, irregular heartbeat, leg pain or swelling, and decreased activity tolerance. Respiratory: Denies breathing difficulty, shortness of breath, wheezing, cough, and sputum. GI: Denies change in appetite, heartburn, constipation, diarrhea, and blood in stool. GU: Denies difficulty urinating, pain with urinating, urgency, frequency, blood in urine. Musculoskeletal: Denies joint  stiffness, pain, swelling, muscle weakness. Skin: Denies rash, itching, mass, tumors, sores, and boils Neurologic: Denies headache, fainting, dizziness, seizures, numbness, and tingling. Psychiatric: Denies depression, anxiety, difficulty sleeping, and memory loss. Endocrine: Denies heat or cold intolerance, and increased thirst or urination. Blood/lymph: Denies easy bruising, easy bruising, and swollen glands     Objective:     BP 119/72 (BP Location: Right Arm)   Pulse 68   Temp 98 F (36.7 C) (Oral)   Resp 18   Ht 5\' 10"  (1.778 m)   Wt 83.9 kg   SpO2 96%   BMI 26.54 kg/m   Constitutional :  alert, cooperative, appears stated age and no distress  Lymphatics/Throat:  no asymmetry, masses, or scars  Respiratory:  clear to auscultation bilaterally  Cardiovascular:  regular rate and rhythm  Gastrointestinal: soft, focal TTP in RLQ, but no guarding, rebound tenderness..   Musculoskeletal: Steady movement  Skin: Cool and moist  Psychiatric: Normal affect, non-agitated, not confused       LABS:  CMP Latest Ref Rng & Units 05/25/2020 10/29/2015 07/20/2011  Glucose 70 - 99 mg/dL 09/17/2011) 277(A) 128(N)  BUN 6 - 20 mg/dL 17 19 18   Creatinine 0.44 - 1.00 mg/dL 867(E 7.20  Sodium 135 - 145 mmol/L 136 138 145  Potassium 3.5 - 5.1 mmol/L 3.7 3.1(L) 3.9  Chloride 98 - 111 mmol/L 103 110 108(H)  CO2 22 - 32 mmol/L 25 20(L) 28  Calcium 8.9 - 10.3 mg/dL 9.3 8.9 9.47)  Total Protein 6.5 - 8.1 g/dL 7.2 - -  Total Bilirubin 0.3 - 1.2 mg/dL 0.9 - -  Alkaline Phos 38 - 126 U/L 40 - -  AST 15 - 41  U/L 14(L) - -  ALT 0 - 44 U/L 15 - -   CBC Latest Ref Rng & Units 05/25/2020 10/29/2015 07/20/2011  WBC 4.0 - 10.5 K/uL 10.6(H) 13.1(H) 8.3  Hemoglobin 12.0 - 15.0 g/dL 29.4 76.5 46.5  Hematocrit 36 - 46 % 42.7 42.3 37.7  Platelets 150 - 400 K/uL 275 255 263    RADS: CLINICAL DATA:  Right-sided abdominal pain  EXAM: CT ABDOMEN AND PELVIS WITH CONTRAST  TECHNIQUE: Multidetector CT  imaging of the abdomen and pelvis was performed using the standard protocol following bolus administration of intravenous contrast.  CONTRAST:  OMNIPAQUE IOHEXOL 300 MG/ML  SOLN  COMPARISON:  11/13/2004  FINDINGS: Lower chest: Included lung bases are clear.  Heart size is normal.  Hepatobiliary: No focal liver abnormality is seen. No gallstones, gallbladder wall thickening, or biliary dilatation.  Pancreas: Unremarkable. No pancreatic ductal dilatation or surrounding inflammatory changes.  Spleen: Normal in size without focal abnormality.  Adrenals/Urinary Tract: Unremarkable adrenal glands. Kidneys enhance symmetrically without focal lesion, stone, or hydronephrosis. Ureters are nondilated. Urinary bladder appears unremarkable.  Stomach/Bowel: There are numerous dilated, fluid-filled loops of small bowel throughout the abdomen measuring up to 3.6 cm in diameter. Somewhat gradual transition point to decompressed bowel within the low right pelvis (series 2, images 72-73. The small bowel within this region appears circumferentially thickened with submucosal edema. No pneumatosis is identified. There is mesenteric edema and fluid. A air-filled, noninflamed appendix is identified in the right lower quadrant (series 2, image 70). No focal colonic thickening or evidence of colonic inflammation. The stomach is within normal limits.  Vascular/Lymphatic: No significant vascular findings are present. No enlarged abdominal or pelvic lymph nodes.  Reproductive: Interval partial hysterectomy. 1.5 cm peripherally enhancing right adnexal cyst or follicle. No left adnexal abnormality.  Other: Small volume ascites. Central mesenteric edema. No organized abdominopelvic fluid collection. No pneumoperitoneum. No abdominal wall hernia.  Musculoskeletal: No acute or significant osseous findings.  IMPRESSION: 1. Low-grade small-bowel obstruction with gradual transition  point within the low right pelvis. The distal small bowel at the level of transition is circumferentially thickened with submucosal edema. These findings raise the suspicion for the possibility of underlying inflammatory bowel disease with possible stricture. Alternatively, obstruction could be secondary to adhesive etiology given history of prior pelvic surgery. 2. Small volume ascites, likely reactive.  These results were called by telephone at the time of interpretation on 05/25/2020 at 2:24 pm to provider Jene Every , who verbally acknowledged these results.   Electronically Signed   By: Duanne Guess D.O.   On: 05/25/2020 14:27 Assessment:   SBO  Plan:    Will recommend monitor with NG tube decompression for now.  IVF, NPO.  Will obtain SBFT tomorrow am, if no ROBF

## 2020-05-25 NOTE — ED Triage Notes (Signed)
Pt to ED via POV c/o right sided abdominal pain that radiates into her back and N/V. Pt states that the pain comes and does. Pt has had similar symptoms before. Pt states that the pain started suddenly last night. Pt is not able to keep anything down. Pt states that when she tries to drink anything it feels like it gets stuck and comes right back up. Pt is in NAD.

## 2020-05-25 NOTE — ED Notes (Signed)
Patient reports continued abdominal pain. PRN morphine given. Patient reports decrease in pain, 1/10 pain.

## 2020-05-25 NOTE — ED Notes (Signed)
NG tube placed without difficulty in the Right nare. 18 french placed at 49 in the nare. Placement assessed and confirmed by auscultation of air. Patient tolerated well

## 2020-05-25 NOTE — ED Notes (Signed)
Brianna RN to transport patient to room.

## 2020-05-25 NOTE — ED Notes (Signed)
Says abdominal pain since 6pm last night.  Nausea and has been vomiting as well.  Says pain right back and around to front and that mid epigastric area feels like a knot.  Says she has had pain before, but it has never lasted this long.  She has been on a diet.

## 2020-05-26 ENCOUNTER — Inpatient Hospital Stay: Payer: 59

## 2020-05-26 ENCOUNTER — Encounter: Payer: Self-pay | Admitting: Internal Medicine

## 2020-05-26 LAB — CBC
HCT: 40.1 % (ref 36.0–46.0)
Hemoglobin: 13.3 g/dL (ref 12.0–15.0)
MCH: 30.1 pg (ref 26.0–34.0)
MCHC: 33.2 g/dL (ref 30.0–36.0)
MCV: 90.7 fL (ref 80.0–100.0)
Platelets: 246 10*3/uL (ref 150–400)
RBC: 4.42 MIL/uL (ref 3.87–5.11)
RDW: 12.7 % (ref 11.5–15.5)
WBC: 9.1 10*3/uL (ref 4.0–10.5)
nRBC: 0 % (ref 0.0–0.2)

## 2020-05-26 LAB — BASIC METABOLIC PANEL
Anion gap: 9 (ref 5–15)
BUN: 18 mg/dL (ref 6–20)
CO2: 26 mmol/L (ref 22–32)
Calcium: 8.6 mg/dL — ABNORMAL LOW (ref 8.9–10.3)
Chloride: 107 mmol/L (ref 98–111)
Creatinine, Ser: 0.51 mg/dL (ref 0.44–1.00)
GFR, Estimated: >60 mL/min (ref >60)
Glucose, Bld: 105 mg/dL — ABNORMAL HIGH (ref 70–99)
Potassium: 3.5 mmol/L (ref 3.5–5.1)
Sodium: 142 mmol/L (ref 135–145)

## 2020-05-26 LAB — HIV ANTIBODY (ROUTINE TESTING W REFLEX): HIV Screen 4th Generation wRfx: NONREACTIVE

## 2020-05-26 MED ORDER — HYDROMORPHONE HCL 1 MG/ML IJ SOLN
1.0000 mg | INTRAMUSCULAR | Status: DC | PRN
  Administered 2020-05-26 – 2020-05-27 (×6): 1 mg via INTRAVENOUS
  Filled 2020-05-26 (×7): qty 1

## 2020-05-26 MED ORDER — LORAZEPAM 2 MG/ML IJ SOLN
1.0000 mg | Freq: Once | INTRAMUSCULAR | Status: AC
  Administered 2020-05-26: 1 mg via INTRAVENOUS
  Filled 2020-05-26: qty 1

## 2020-05-26 MED ORDER — KCL IN DEXTROSE-NACL 20-5-0.9 MEQ/L-%-% IV SOLN
INTRAVENOUS | Status: DC
  Filled 2020-05-26 (×5): qty 1000

## 2020-05-26 MED ORDER — DIATRIZOATE MEGLUMINE & SODIUM 66-10 % PO SOLN
90.0000 mL | Freq: Once | ORAL | Status: AC
  Administered 2020-05-26: 90 mL via NASOGASTRIC

## 2020-05-26 NOTE — Progress Notes (Signed)
Subjective:  CC: Cheyenne Hill is a 49 y.o. female  Hospital stay day 1,   sbo  HPI: No acute issues overnight.  Got some sleep. Complaining of throat pain today  ROS:  General: Denies weight loss, weight gain, fatigue, fevers, chills, and night sweats. Heart: Denies chest pain, palpitations, racing heart, irregular heartbeat, leg pain or swelling, and decreased activity tolerance. Respiratory: Denies breathing difficulty, shortness of breath, wheezing, cough, and sputum. GI: Denies change in appetite, heartburn, nausea, vomiting, constipation, diarrhea, and blood in stool. GU: Denies difficulty urinating, pain with urinating, urgency, frequency, blood in urine.   Objective:   Temp:  [98 F (36.7 C)-99.3 F (37.4 C)] 98.4 F (36.9 C) (11/13 0403) Pulse Rate:  [50-86] 69 (11/13 0403) Resp:  [16-18] 16 (11/13 0403) BP: (100-124)/(67-87) 106/67 (11/13 0403) SpO2:  [93 %-100 %] 93 % (11/13 0403) Weight:  [83.9 kg] 83.9 kg (11/12 1031)     Height: 5\' 10"  (177.8 cm) Weight: 83.9 kg BMI (Calculated): 26.54   Intake/Output this shift:   Intake/Output Summary (Last 24 hours) at 05/26/2020 0934 Last data filed at 05/26/2020 0424 Gross per 24 hour  Intake 1084.81 ml  Output 1000 ml  Net 84.81 ml    Constitutional :  alert, cooperative, appears stated age and no distress  Respiratory:  clear to auscultation bilaterally  Cardiovascular:  regular rate and rhythm  Gastrointestinal: soft, non-tender; bowel sounds normal; no masses,  no organomegaly.   Skin: Cool and moist.   Psychiatric: Normal affect, non-agitated, not confused       LABS:  CMP Latest Ref Rng & Units 05/26/2020 05/25/2020 10/29/2015  Glucose 70 - 99 mg/dL 10/31/2015) 283(M) 629(U)  BUN 6 - 20 mg/dL 18 17 19   Creatinine 0.44 - 1.00 mg/dL 765(Y 6.50  Sodium 135 - 145 mmol/L 142 136 138  Potassium 3.5 - 5.1 mmol/L 3.5 3.7 3.1(L)  Chloride 98 - 111 mmol/L 107 103 110  CO2 22 - 32 mmol/L 26 25 20(L)  Calcium 8.9 -  10.3 mg/dL 3.54) 9.3 8.9  Total Protein 6.5 - 8.1 g/dL - 7.2 -  Total Bilirubin 0.3 - 1.2 mg/dL - 0.9 -  Alkaline Phos 38 - 126 U/L - 40 -  AST 15 - 41 U/L - 14(L) -  ALT 0 - 44 U/L - 15 -   CBC Latest Ref Rng & Units 05/26/2020 05/25/2020 10/29/2015  WBC 4.0 - 10.5 K/uL 9.1 10.6(H) 13.1(H)  Hemoglobin 12.0 - 15.0 g/dL 13/06/2020 10/31/2015 75.1  Hematocrit 36 - 46 % 40.1 42.7 42.3  Platelets 150 - 400 K/uL 246 275 255    RADS: n/a Assessment:   SBO, abd pain resolved, but no return of bowel function.  Ordered small bowel follow through to see if obstruction still persistent.  Continue to encourage ambulation till then.  Ok for sips of water, gum, hard candy, chloreseptic spray for throat pain

## 2020-05-26 NOTE — Progress Notes (Addendum)
Progress Note    Cheyenne Hill  ZOX:[id number] DOB: Jul 26, 1970  DOA: 05/25/2020 PCP: Patient, No Pcp Per      Brief Narrative:    Medical records reviewed and are as summarized below:  Cheyenne Hill is a 49 y.o. female with no significant medical history who presented to the hospital with abdominal pain, nausea and vomiting.  She was found to have small bowel obstruction.  She was treated conservatively with bowel rest, analgesics, antiemetics, gastric decompression with NG tube and IV fluids.  She was seen in consultation by the general surgeon who recommended conservative management.      Assessment/Plan:   Principal Problem:   SBO (small bowel obstruction) (HCC) Active Problems:   Nicotine dependence   Body mass index is 26.54 kg/m.    Abdominal x-ray obtained today showed persistent small bowel obstruction Keep NPO Continue gastric decompression with NG tube drainage Continue IV fluids for hydration Continue analgesics for pain Chloraseptic Spray as needed for throat pain Follow-up with general surgeon for further recommendations   Diet Order            Diet NPO time specified Except for: Ice Chips  Diet effective now                    Consultants:  General surgeon  Procedures:  None    Medications:   . enoxaparin (LOVENOX) injection  40 mg Subcutaneous Q24H  . melatonin  5 mg Oral QHS  . pantoprazole (PROTONIX) IV  40 mg Intravenous Q24H   Continuous Infusions: . dextrose 5 % and 0.9 % NaCl with KCl 20 mEq/L       Anti-infectives (From admission, onward)   None             Family Communication/Anticipated D/C date and plan/Code Status   DVT prophylaxis: enoxaparin (LOVENOX) injection 40 mg Start: 05/25/20 2200     Code Status: Full Code  Family Communication: None Disposition Plan:    Status is: Inpatient  Remains inpatient appropriate because:IV treatments appropriate due to intensity of illness or  inability to take PO   Dispo: The patient is from: Home              Anticipated d/c is to: Home              Anticipated d/c date is: 2 days              Patient currently is not medically stable to d/c.           Subjective:   C/o throat pain from NG tube.  Abdominal pain is better.  No vomiting.  Last bowel movement was about 2 days ago.  Objective:    Vitals:   05/25/20 1933 05/26/20 0018 05/26/20 0403 05/26/20 1150  BP: 119/72 124/77 106/67 117/74  Pulse: 68 62 69 (!) 56  Resp: 18 17 16 18   Temp: 98 F (36.7 C) 99.3 F (37.4 C) 98.4 F (36.9 C) 98 F (36.7 C)  TempSrc: Oral Oral Oral Oral  SpO2: 96%  93% 97%  Weight:      Height:       No data found.   Intake/Output Summary (Last 24 hours) at 05/26/2020 1351 Last data filed at 05/26/2020 1105 Gross per 24 hour  Intake 1084.81 ml  Output 1400 ml  Net -315.19 ml   Filed Weights   05/25/20 1031  Weight: 83.9 kg    Exam:  GEN: NAD SKIN: No rash EYES: EOMI ENT: MMM, NG tube to intermittent low wall suction CV: RRR PULM: CTA B ABD: soft, ND, NT, +BS CNS: AAO x 3, non focal EXT: No edema or tenderness   Data Reviewed:   I have personally reviewed following labs and imaging studies:  Labs: Labs show the following:   Basic Metabolic Panel: Recent Labs  Lab 05/25/20 1051 05/26/20 0330  NA 136 142  K 3.7 3.5  CL 103 107  CO2 25 26  GLUCOSE 143* 105*  BUN 17 18  CREATININE 0.53 0.51  CALCIUM 9.3 8.6*   GFR Estimated Creatinine Clearance: 101.4 mL/min (by C-G formula based on SCr of 0.51 mg/dL). Liver Function Tests: Recent Labs  Lab 05/25/20 1051  AST 14*  ALT 15  ALKPHOS 40  BILITOT 0.9  PROT 7.2  ALBUMIN 4.4   Recent Labs  Lab 05/25/20 1051  LIPASE 24   No results for input(s): AMMONIA in the last 168 hours. Coagulation profile No results for input(s): INR, PROTIME in the last 168 hours.  CBC: Recent Labs  Lab 05/25/20 1051 05/26/20 0330  WBC 10.6* 9.1  HGB  14.6 13.3  HCT 42.7 40.1  MCV 89.7 90.7  PLT 275 246   Cardiac Enzymes: No results for input(s): CKTOTAL, CKMB, CKMBINDEX, TROPONINI in the last 168 hours. BNP (last 3 results) No results for input(s): PROBNP in the last 8760 hours. CBG: No results for input(s): GLUCAP in the last 168 hours. D-Dimer: No results for input(s): DDIMER in the last 72 hours. Hgb A1c: No results for input(s): HGBA1C in the last 72 hours. Lipid Profile: No results for input(s): CHOL, HDL, LDLCALC, TRIG, CHOLHDL, LDLDIRECT in the last 72 hours. Thyroid function studies: No results for input(s): TSH, T4TOTAL, T3FREE, THYROIDAB in the last 72 hours.  Invalid input(s): FREET3 Anemia work up: No results for input(s): VITAMINB12, FOLATE, FERRITIN, TIBC, IRON, RETICCTPCT in the last 72 hours. Sepsis Labs: Recent Labs  Lab 05/25/20 1051 05/26/20 0330  WBC 10.6* 9.1    Microbiology Recent Results (from the past 240 hour(s))  Respiratory Panel by RT PCR (Flu A&B, Covid) - Nasopharyngeal Swab     Status: None   Collection Time: 05/25/20  3:51 PM   Specimen: Nasopharyngeal Swab  Result Value Ref Range Status   SARS Coronavirus 2 by RT PCR NEGATIVE NEGATIVE Final    Comment: (NOTE) SARS-CoV-2 target nucleic acids are NOT DETECTED.  The SARS-CoV-2 RNA is generally detectable in upper respiratoy specimens during the acute phase of infection. The lowest concentration of SARS-CoV-2 viral copies this assay can detect is 131 copies/mL. A negative result does not preclude SARS-Cov-2 infection and should not be used as the sole basis for treatment or other patient management decisions. A negative result may occur with  improper specimen collection/handling, submission of specimen other than nasopharyngeal swab, presence of viral mutation(s) within the areas targeted by this assay, and inadequate number of viral copies (<131 copies/mL). A negative result must be combined with clinical observations, patient  history, and epidemiological information. The expected result is Negative.  Fact Sheet for Patients:  https://www.moore.com/  Fact Sheet for Healthcare Providers:  https://www.young.biz/  This test is no t yet approved or cleared by the [place] FDA and  has been authorized for detection and/or diagnosis of SARS-CoV-2 by FDA under an Emergency Use Authorization (EUA). This EUA will remain  in effect (meaning this test can be used) for the duration of the COVID-19 declaration under  Section 564(b)(1) of the Act, 21 U.S.C. section 360bbb-3(b)(1), unless the authorization is terminated or revoked sooner.     Influenza A by PCR NEGATIVE NEGATIVE Final   Influenza B by PCR NEGATIVE NEGATIVE Final    Comment: (NOTE) The Xpert Xpress SARS-CoV-2/FLU/RSV assay is intended as an aid in  the diagnosis of influenza from Nasopharyngeal swab specimens and  should not be used as a sole basis for treatment. Nasal washings and  aspirates are unacceptable for Xpert Xpress SARS-CoV-2/FLU/RSV  testing.  Fact Sheet for Patients: https://www.moore.com/  Fact Sheet for Healthcare Providers: https://www.young.biz/  This test is not yet approved or cleared by the [place] FDA and  has been authorized for detection and/or diagnosis of SARS-CoV-2 by  FDA under an Emergency Use Authorization (EUA). This EUA will remain  in effect (meaning this test can be used) for the duration of the  Covid-19 declaration under Section 564(b)(1) of the Act, 21  U.S.C. section 360bbb-3(b)(1), unless the authorization is  terminated or revoked. Performed at [facility], [address] [place], [place] [address]     Procedures and diagnostic studies:  DG Abd 1 View  Result Date: 05/26/2020 CLINICAL DATA:  Small bowel obstruction EXAM: ABDOMEN - 1 VIEW COMPARISON:  CT abdomen and pelvis May 25, 2020 FINDINGS:  Nasogastric tube tip is in the stomach with the side port at the gastroesophageal junction. There are several loops of mildly dilated small bowel without air-fluid levels. No free air. Moderate stool in colon. No abnormal calcifications. IMPRESSION: Nasogastric tube tip in stomach with side port at gastroesophageal junction. It may be prudent to consider advancing nasogastric tube 4-5 cm. Loops of dilated small bowel suggest that there may be a degree of persistent small bowel obstruction. No free air. Moderate stool in colon. Electronically Signed   By: Bretta Bang III M.D.   On: 05/26/2020 11:46   CT ABDOMEN PELVIS W CONTRAST  Result Date: 05/25/2020 CLINICAL DATA:  Right-sided abdominal pain EXAM: CT ABDOMEN AND PELVIS WITH CONTRAST TECHNIQUE: Multidetector CT imaging of the abdomen and pelvis was performed using the standard protocol following bolus administration of intravenous contrast. CONTRAST:  OMNIPAQUE IOHEXOL 300 MG/ML  SOLN COMPARISON:  11/13/2004 FINDINGS: Lower chest: Included lung bases are clear.  Heart size is normal. Hepatobiliary: No focal liver abnormality is seen. No gallstones, gallbladder wall thickening, or biliary dilatation. Pancreas: Unremarkable. No pancreatic ductal dilatation or surrounding inflammatory changes. Spleen: Normal in size without focal abnormality. Adrenals/Urinary Tract: Unremarkable adrenal glands. Kidneys enhance symmetrically without focal lesion, stone, or hydronephrosis. Ureters are nondilated. Urinary bladder appears unremarkable. Stomach/Bowel: There are numerous dilated, fluid-filled loops of small bowel throughout the abdomen measuring up to 3.6 cm in diameter. Somewhat gradual transition point to decompressed bowel within the low right pelvis (series 2, images 72-73. The small bowel within this region appears circumferentially thickened with submucosal edema. No pneumatosis is identified. There is mesenteric edema and fluid. A air-filled,  noninflamed appendix is identified in the right lower quadrant (series 2, image 70). No focal colonic thickening or evidence of colonic inflammation. The stomach is within normal limits. Vascular/Lymphatic: No significant vascular findings are present. No enlarged abdominal or pelvic lymph nodes. Reproductive: Interval partial hysterectomy. 1.5 cm peripherally enhancing right adnexal cyst or follicle. No left adnexal abnormality. Other: Small volume ascites. Central mesenteric edema. No organized abdominopelvic fluid collection. No pneumoperitoneum. No abdominal wall hernia. Musculoskeletal: No acute or significant osseous findings. IMPRESSION: 1. Low-grade small-bowel obstruction with gradual transition  point within the low right pelvis. The distal small bowel at the level of transition is circumferentially thickened with submucosal edema. These findings raise the suspicion for the possibility of underlying inflammatory bowel disease with possible stricture. Alternatively, obstruction could be secondary to adhesive etiology given history of prior pelvic surgery. 2. Small volume ascites, likely reactive. These results were called by telephone at the time of interpretation on 05/25/2020 at 2:24 pm to provider Jene EveryOBERT KINNER , who verbally acknowledged these results. Electronically Signed   By: Duanne GuessNicholas  Plundo D.O.   On: 05/25/2020 14:27               LOS: 1 day   Sade Mehlhoff  [facility]   Pager on [email or web address]. If 7PM-7AM, please contact night-coverage at [email or web address]     05/26/2020, 1:51 PM

## 2020-05-27 MED ORDER — ACETAMINOPHEN 325 MG PO TABS: 650.0000 mg | ORAL_TABLET | Freq: Four times a day (QID) | ORAL | Status: AC | PRN

## 2020-05-27 MED ORDER — ORAL CARE MOUTH RINSE: 15.0000 mL | Freq: Two times a day (BID) | OROMUCOSAL | Status: DC

## 2020-05-27 MED ORDER — ACETAMINOPHEN 325 MG PO TABS
650.0000 mg | ORAL_TABLET | Freq: Four times a day (QID) | ORAL | Status: DC | PRN
  Administered 2020-05-27: 650 mg via ORAL
  Filled 2020-05-27: qty 2

## 2020-05-27 NOTE — Plan of Care (Signed)
Discharge teaching completed with patient who is in stable condition. 

## 2020-05-27 NOTE — Progress Notes (Signed)
Earlier in the shift patient complained of discomfort to the throat from the NG tube, reached out to Dr. Tonna Boehringer who gave order to d/c NG tube.  PIV had infiltrated, Per Dr. Tonna Boehringer ok to not replace PIV and lab draws could be discontinued as well.  Further ordered clear liquid diet for the patient and advance to regular diet if tolerated.  Patient tolerated clear liquids and has been advanced to regular diet as ordered.  Patient reports relief in throat pain since the NG tube has been discontinued.

## 2020-05-27 NOTE — Progress Notes (Signed)
Patient was given ice chips twice during the shift. She was informed that she should have a minimal amount. NG tube output was 675 ml at end of shift. Getting too much ice can misleading output.   Patient requested medication to help anxiety at beginning of shift. NP Ouma ordered Ativan once and this was given to her 1 hour after Dilaudid PRN. Patient requested Ativan again around 4am as a PRN. NP Ouma stated that Ativan was just a one time order. Suggested to patient to talk to her doctor during rounds.

## 2020-05-27 NOTE — Progress Notes (Signed)
Subjective:  CC: Cheyenne Hill is a 49 y.o. female  Hospital stay day 2,   sbo  HPI: No acute issues overnight.  Had bowel movement now. ROS:  General: Denies weight loss, weight gain, fatigue, fevers, chills, and night sweats. Heart: Denies chest pain, palpitations, racing heart, irregular heartbeat, leg pain or swelling, and decreased activity tolerance. Respiratory: Denies breathing difficulty, shortness of breath, wheezing, cough, and sputum. GI: Denies change in appetite, heartburn, nausea, vomiting, constipation, diarrhea, and blood in stool. GU: Denies difficulty urinating, pain with urinating, urgency, frequency, blood in urine.   Objective:   Temp:  [98.1 F (36.7 C)-99 F (37.2 C)] 99 F (37.2 C) (11/14 1300) Pulse Rate:  [60-80] 80 (11/14 1300) Resp:  [12-16] 14 (11/14 1300) BP: (100-135)/(65-85) 100/69 (11/14 1300) SpO2:  [94 %-98 %] 97 % (11/14 1300)     Height: 5\' 10"  (177.8 cm) Weight: 83.9 kg BMI (Calculated): 26.54   Intake/Output this shift:   Intake/Output Summary (Last 24 hours) at 05/27/2020 1515 Last data filed at 05/27/2020 1300 Gross per 24 hour  Intake 1707.57 ml  Output 1375 ml  Net 332.57 ml    Constitutional :  alert, cooperative, appears stated age and no distress  Respiratory:  clear to auscultation bilaterally  Cardiovascular:  regular rate and rhythm  Gastrointestinal: soft, non-tender; bowel sounds normal; no masses,  no organomegaly.   Skin: Cool and moist.   Psychiatric: Normal affect, non-agitated, not confused       LABS:  CMP Latest Ref Rng & Units 05/26/2020 05/25/2020 10/29/2015  Glucose 70 - 99 mg/dL 10/31/2015) 845(X) 646(O)  BUN 6 - 20 mg/dL 18 17 19   Creatinine 0.44 - 1.00 mg/dL 032(Z 2.24  Sodium 135 - 145 mmol/L 142 136 138  Potassium 3.5 - 5.1 mmol/L 3.5 3.7 3.1(L)  Chloride 98 - 111 mmol/L 107 103 110  CO2 22 - 32 mmol/L 26 25 20(L)  Calcium 8.9 - 10.3 mg/dL 8.25) 9.3 8.9  Total Protein 6.5 - 8.1 g/dL - 7.2 -   Total Bilirubin 0.3 - 1.2 mg/dL - 0.9 -  Alkaline Phos 38 - 126 U/L - 40 -  AST 15 - 41 U/L - 14(L) -  ALT 0 - 44 U/L - 15 -   CBC Latest Ref Rng & Units 05/26/2020 05/25/2020 10/29/2015  WBC 4.0 - 10.5 K/uL 9.1 10.6(H) 13.1(H)  Hemoglobin 12.0 - 15.0 g/dL 13/06/2020 10/31/2015 88.8  Hematocrit 36 - 46 % 40.1 42.7 42.3  Platelets 150 - 400 K/uL 246 275 255    RADS: n/a Assessment:   SBO, abd pain resolved, with return of bowel function.  NG tube removed and can advance diet as tolerated.  Patient okay to be discharged once tolerating regular diet.  Please call surgery with any recurrent or persistent questions.  No further follow-up needed as an outpatient.  All questions addressed with patient and family at bedside.

## 2020-05-27 NOTE — Discharge Summary (Signed)
Physician Discharge Summary  Cheyenne Hill ZOX:[id number] DOB: 16-Nov-1970 DOA: 05/25/2020  PCP: Patient, No Pcp Per  Admit date: 05/25/2020 Discharge date: 05/27/2020  Discharge disposition: Home   Recommendations for Outpatient Follow-Up:   Follow-up with PCP for routine health maintenance   Discharge Diagnosis:   Principal Problem:   SBO (small bowel obstruction) (HCC) Active Problems:   Nicotine dependence    Discharge Condition: Stable.  Diet recommendation:  Diet Order            Diet regular Room service appropriate? Yes; Fluid consistency: Thin  Diet effective now           Diet - low sodium heart healthy                   Code Status: Full Code     Hospital Course:   [name] is a 49 y.o. female with no significant medical history who presented to the hospital with abdominal pain, nausea and vomiting.  She was found to have small bowel obstruction.  She was treated conservatively with bowel rest, analgesics, antiemetics, gastric decompression with NG tube and IV fluids.  She was seen in consultation by the general surgeon who recommended conservative management.  She complained of sore throat from NG tube irritation.  NGT was removed and she was started on liquid diet.  She was eventually able to tolerate a regular diet.  She has been able to move her bowels.  Abdominal pain and vomiting have resolved.  Her condition has improved and she is deemed stable for discharge home today.    Medical Consultants:    General surgeon, Dr. Tonna BoehringerSakai   Discharge Exam:    Vitals:   05/26/20 1938 05/27/20 0003 05/27/20 0412 05/27/20 1300  BP: 108/65 128/82 135/85 100/69  Pulse: 65 63 60 80  Resp: 16 16 12 14   Temp: 98.5 F (36.9 C) 98.5 F (36.9 C) 98.1 F (36.7 C) 99 F (37.2 C)  TempSrc: Oral Oral Oral Oral  SpO2: 98% 94% 97% 97%  Weight:      Height:         GEN: NAD SKIN: No rash EYES: EOMI ENT: MMM, no pharyngeal erythema or  exudates CV: RRR PULM: CTA B ABD: soft, ND, NT, +BS CNS: AAO x 3, non focal EXT: No edema or tenderness   The results of significant diagnostics from this hospitalization (including imaging, microbiology, ancillary and laboratory) are listed below for reference.     Procedures and Diagnostic Studies:   DG Abd 1 View  Result Date: 05/26/2020 CLINICAL DATA:  Small bowel obstruction EXAM: ABDOMEN - 1 VIEW COMPARISON:  CT abdomen and pelvis May 25, 2020 FINDINGS: Nasogastric tube tip is in the stomach with the side port at the gastroesophageal junction. There are several loops of mildly dilated small bowel without air-fluid levels. No free air. Moderate stool in colon. No abnormal calcifications. IMPRESSION: Nasogastric tube tip in stomach with side port at gastroesophageal junction. It may be prudent to consider advancing nasogastric tube 4-5 cm. Loops of dilated small bowel suggest that there may be a degree of persistent small bowel obstruction. No free air. Moderate stool in colon. Electronically Signed   By: Bretta BangWilliam  Woodruff III M.D.   On: 05/26/2020 11:46   CT ABDOMEN PELVIS W CONTRAST  Result Date: 05/25/2020 CLINICAL DATA:  Right-sided abdominal pain EXAM: CT ABDOMEN AND PELVIS WITH CONTRAST TECHNIQUE: Multidetector CT imaging of the abdomen and pelvis was performed using  the standard protocol following bolus administration of intravenous contrast. CONTRAST:  OMNIPAQUE IOHEXOL 300 MG/ML  SOLN COMPARISON:  11/13/2004 FINDINGS: Lower chest: Included lung bases are clear.  Heart size is normal. Hepatobiliary: No focal liver abnormality is seen. No gallstones, gallbladder wall thickening, or biliary dilatation. Pancreas: Unremarkable. No pancreatic ductal dilatation or surrounding inflammatory changes. Spleen: Normal in size without focal abnormality. Adrenals/Urinary Tract: Unremarkable adrenal glands. Kidneys enhance symmetrically without focal lesion, stone, or hydronephrosis.  Ureters are nondilated. Urinary bladder appears unremarkable. Stomach/Bowel: There are numerous dilated, fluid-filled loops of small bowel throughout the abdomen measuring up to 3.6 cm in diameter. Somewhat gradual transition point to decompressed bowel within the low right pelvis (series 2, images 72-73. The small bowel within this region appears circumferentially thickened with submucosal edema. No pneumatosis is identified. There is mesenteric edema and fluid. A air-filled, noninflamed appendix is identified in the right lower quadrant (series 2, image 70). No focal colonic thickening or evidence of colonic inflammation. The stomach is within normal limits. Vascular/Lymphatic: No significant vascular findings are present. No enlarged abdominal or pelvic lymph nodes. Reproductive: Interval partial hysterectomy. 1.5 cm peripherally enhancing right adnexal cyst or follicle. No left adnexal abnormality. Other: Small volume ascites. Central mesenteric edema. No organized abdominopelvic fluid collection. No pneumoperitoneum. No abdominal wall hernia. Musculoskeletal: No acute or significant osseous findings. IMPRESSION: 1. Low-grade small-bowel obstruction with gradual transition point within the low right pelvis. The distal small bowel at the level of transition is circumferentially thickened with submucosal edema. These findings raise the suspicion for the possibility of underlying inflammatory bowel disease with possible stricture. Alternatively, obstruction could be secondary to adhesive etiology given history of prior pelvic surgery. 2. Small volume ascites, likely reactive. These results were called by telephone at the time of interpretation on 05/25/2020 at 2:24 pm to provider Jene Every , who verbally acknowledged these results. Electronically Signed   By: Duanne Guess D.O.   On: 05/25/2020 14:27   DG Abd Portable 1V-Small Bowel Obstruction Protocol-initial, 8 hr delay  Result Date:  05/26/2020 CLINICAL DATA:  Small bowel obstruction. 8 hour postcontrast image. EXAM: PORTABLE ABDOMEN - 1 VIEW COMPARISON:  Body CT May 25, 2020 FINDINGS: Persistently dilated small bowel loops in the mid abdomen with maximum diameter of 3.2 cm. Oral contrast opacifies normal in appearance colon.  Eighty Enteric catheter overlies the expected location of the gastric body. IMPRESSION: 1. Persistently dilated small bowel loops in the mid abdomen. 2. Passage of oral contrast to the colon. 3. Findings are consistent with early or incomplete small bowel obstruction, or ileus. Electronically Signed   By: Ted Mcalpine M.D.   On: 05/26/2020 19:37     Labs:   Basic Metabolic Panel: Recent Labs  Lab 05/25/20 1051 05/26/20 0330  NA 136 142  K 3.7 3.5  CL 103 107  CO2 25 26  GLUCOSE 143* 105*  BUN 17 18  CREATININE 0.53 0.51  CALCIUM 9.3 8.6*   GFR Estimated Creatinine Clearance: 101.4 mL/min (by C-G formula based on SCr of 0.51 mg/dL). Liver Function Tests: Recent Labs  Lab 05/25/20 1051  AST 14*  ALT 15  ALKPHOS 40  BILITOT 0.9  PROT 7.2  ALBUMIN 4.4   Recent Labs  Lab 05/25/20 1051  LIPASE 24   No results for input(s): AMMONIA in the last 168 hours. Coagulation profile No results for input(s): INR, PROTIME in the last 168 hours.  CBC: Recent Labs  Lab 05/25/20 1051 05/26/20 0330  WBC  10.6* 9.1  HGB 14.6 13.3  HCT 42.7 40.1  MCV 89.7 90.7  PLT 275 246   Cardiac Enzymes: No results for input(s): CKTOTAL, CKMB, CKMBINDEX, TROPONINI in the last 168 hours. BNP: Invalid input(s): POCBNP CBG: No results for input(s): GLUCAP in the last 168 hours. D-Dimer No results for input(s): DDIMER in the last 72 hours. Hgb A1c No results for input(s): HGBA1C in the last 72 hours. Lipid Profile No results for input(s): CHOL, HDL, LDLCALC, TRIG, CHOLHDL, LDLDIRECT in the last 72 hours. Thyroid function studies No results for input(s): TSH, T4TOTAL, T3FREE, THYROIDAB  in the last 72 hours.  Invalid input(s): FREET3 Anemia work up No results for input(s): VITAMINB12, FOLATE, FERRITIN, TIBC, IRON, RETICCTPCT in the last 72 hours. Microbiology Recent Results (from the past 240 hour(s))  Respiratory Panel by RT PCR (Flu A&B, Covid) - Nasopharyngeal Swab     Status: None   Collection Time: 05/25/20  3:51 PM   Specimen: Nasopharyngeal Swab  Result Value Ref Range Status   SARS Coronavirus 2 by RT PCR NEGATIVE NEGATIVE Final    Comment: (NOTE) SARS-CoV-2 target nucleic acids are NOT DETECTED.  The SARS-CoV-2 RNA is generally detectable in upper respiratoy specimens during the acute phase of infection. The lowest concentration of SARS-CoV-2 viral copies this assay can detect is 131 copies/mL. A negative result does not preclude SARS-Cov-2 infection and should not be used as the sole basis for treatment or other patient management decisions. A negative result may occur with  improper specimen collection/handling, submission of specimen other than nasopharyngeal swab, presence of viral mutation(s) within the areas targeted by this assay, and inadequate number of viral copies (<131 copies/mL). A negative result must be combined with clinical observations, patient history, and epidemiological information. The expected result is Negative.  Fact Sheet for Patients:  https://www.moore.com/  Fact Sheet for Healthcare Providers:  https://www.young.biz/  This test is no t yet approved or cleared by the [place] FDA and  has been authorized for detection and/or diagnosis of SARS-CoV-2 by FDA under an Emergency Use Authorization (EUA). This EUA will remain  in effect (meaning this test can be used) for the duration of the COVID-19 declaration under Section 564(b)(1) of the Act, 21 U.S.C. section 360bbb-3(b)(1), unless the authorization is terminated or revoked sooner.     Influenza A by PCR NEGATIVE NEGATIVE Final    Influenza B by PCR NEGATIVE NEGATIVE Final    Comment: (NOTE) The Xpert Xpress SARS-CoV-2/FLU/RSV assay is intended as an aid in  the diagnosis of influenza from Nasopharyngeal swab specimens and  should not be used as a sole basis for treatment. Nasal washings and  aspirates are unacceptable for Xpert Xpress SARS-CoV-2/FLU/RSV  testing.  Fact Sheet for Patients: https://www.moore.com/  Fact Sheet for Healthcare Providers: https://www.young.biz/  This test is not yet approved or cleared by the [place] FDA and  has been authorized for detection and/or diagnosis of SARS-CoV-2 by  FDA under an Emergency Use Authorization (EUA). This EUA will remain  in effect (meaning this test can be used) for the duration of the  Covid-19 declaration under Section 564(b)(1) of the Act, 21  U.S.C. section 360bbb-3(b)(1), unless the authorization is  terminated or revoked. Performed at [facility], [address] [place], [place] [address]      Discharge Instructions:   Discharge Instructions    Diet - low sodium heart healthy   Complete by: As directed    Discharge instructions   Complete by: As  directed    Avoid tobacco products   Increase activity slowly   Complete by: As directed    Schedule appointment   Complete by: As directed    Follow up with PCP for routine health maintenance     Allergies as of 05/27/2020   No Known Allergies     Medication List    STOP taking these medications   albuterol 108 (90 Base) MCG/ACT inhaler Commonly known as: VENTOLIN HFA     TAKE these medications   acetaminophen 325 MG tablet Commonly known as: TYLENOL Take 2 tablets (650 mg total) by mouth every 6 (six) hours as needed for up to 3 days for mild pain or moderate pain.         Time coordinating discharge: 28 minutes  Signed:  Draiden Mirsky  [facility] 05/27/2020, 4:51 PM   Pager on [email or web address]. If 7PM-7AM,  please contact night-coverage at [email or web address]

## 2020-05-27 NOTE — Plan of Care (Signed)
Continuing with plan of care. 

## 2020-10-31 DIAGNOSIS — F331 Major depressive disorder, recurrent, moderate: Secondary | ICD-10-CM | POA: Insufficient documentation

## 2020-10-31 DIAGNOSIS — F419 Anxiety disorder, unspecified: Secondary | ICD-10-CM | POA: Insufficient documentation

## 2022-01-21 IMAGING — US US BREAST*L* LIMITED INC AXILLA
1 series · 3 of 3 positions shown · non-contrast
Comparison: None

CLINICAL DATA: Palpable lump superior to the left breast.

EXAM:
DIGITAL DIAGNOSTIC BILATERAL MAMMOGRAM WITH CAD AND TOMO
ULTRASOUND LEFT BREAST

[Series 1: us breast*left* limited inc axilla · 0.06mm/px · 3 of 3 slices shown]
[im 1/3]
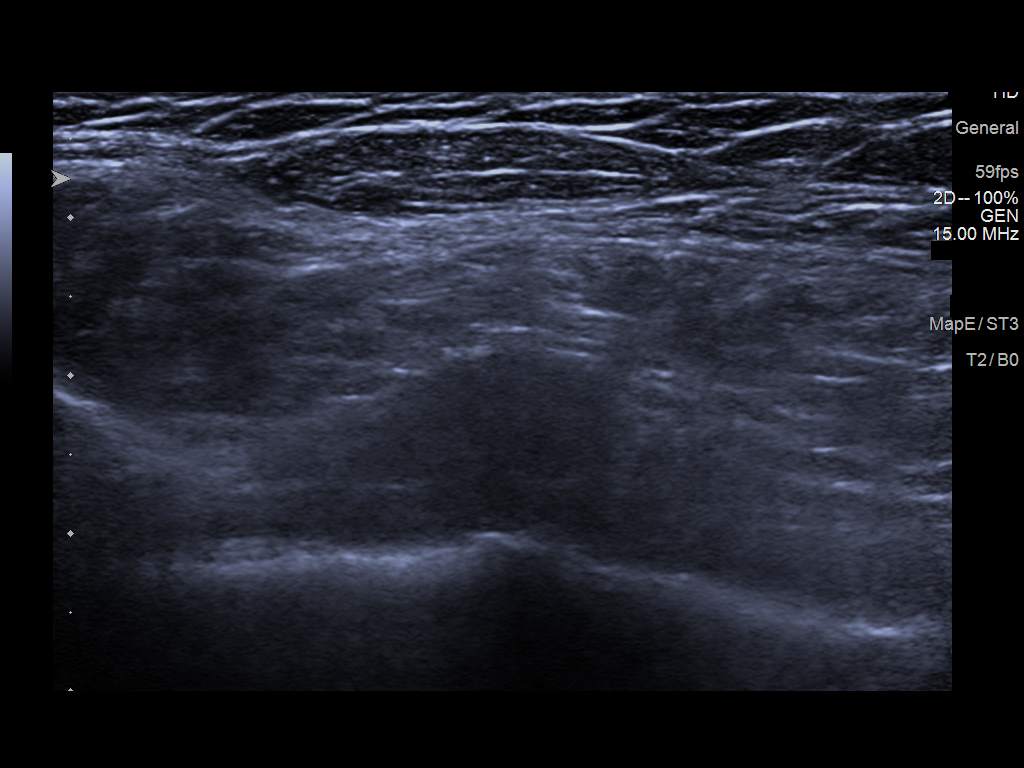
[im 2/3]
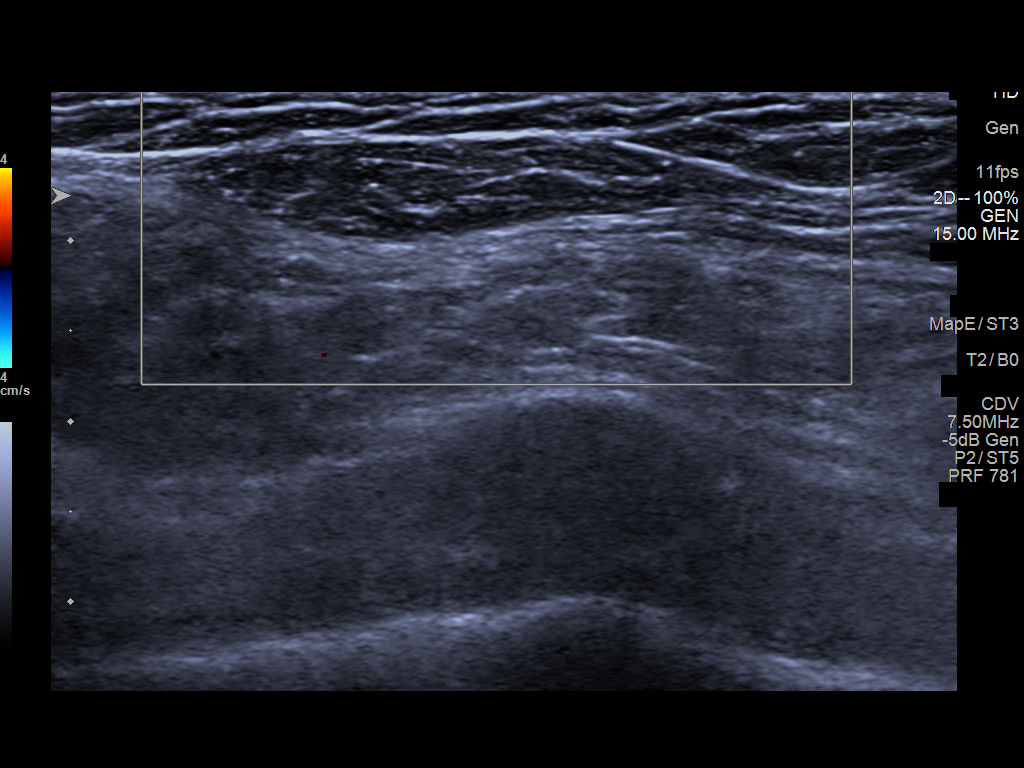
[im 3/3]
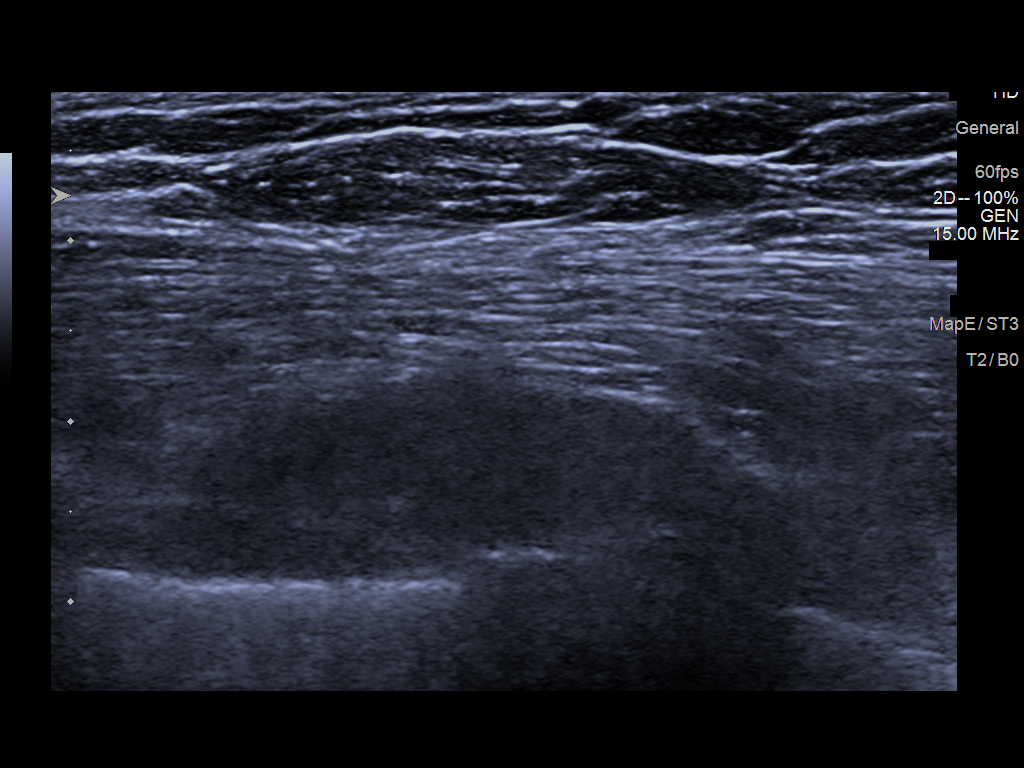

[3 of 3 positions shown; findings below may reference images not displayed]

ACR Breast Density Category b: There are scattered areas of
fibroglandular density.
FINDINGS: No suspicious masses, calcifications, or distortion are identified
in either breast.

Mammographic images were processed with CAD.

On physical exam, no suspicious lumps are identified.

Targeted ultrasound is performed, showing normal tissue in the
region of the patient's palpable lump. An isoechoic lipoma is
possible.
IMPRESSION: No mammographic or sonographic evidence of malignancy.

RECOMMENDATION:
Treatment of the patient's lump should be based on clinical and
physical exam given lack of imaging findings.

I have discussed the findings and recommendations with the patient.
If applicable, a reminder letter will be sent to the patient
regarding the next appointment.

BI-RADS CATEGORY  2: Benign.

## 2022-07-05 IMAGING — DX DG ABDOMEN 1V
2 series · 2 of 2 positions shown · non-contrast
Comparison: CT abdomen and pelvis May 25, 2020

CLINICAL DATA: Small bowel obstruction

EXAM:
ABDOMEN - 1 VIEW

[abdomen supine (1 of 2)]
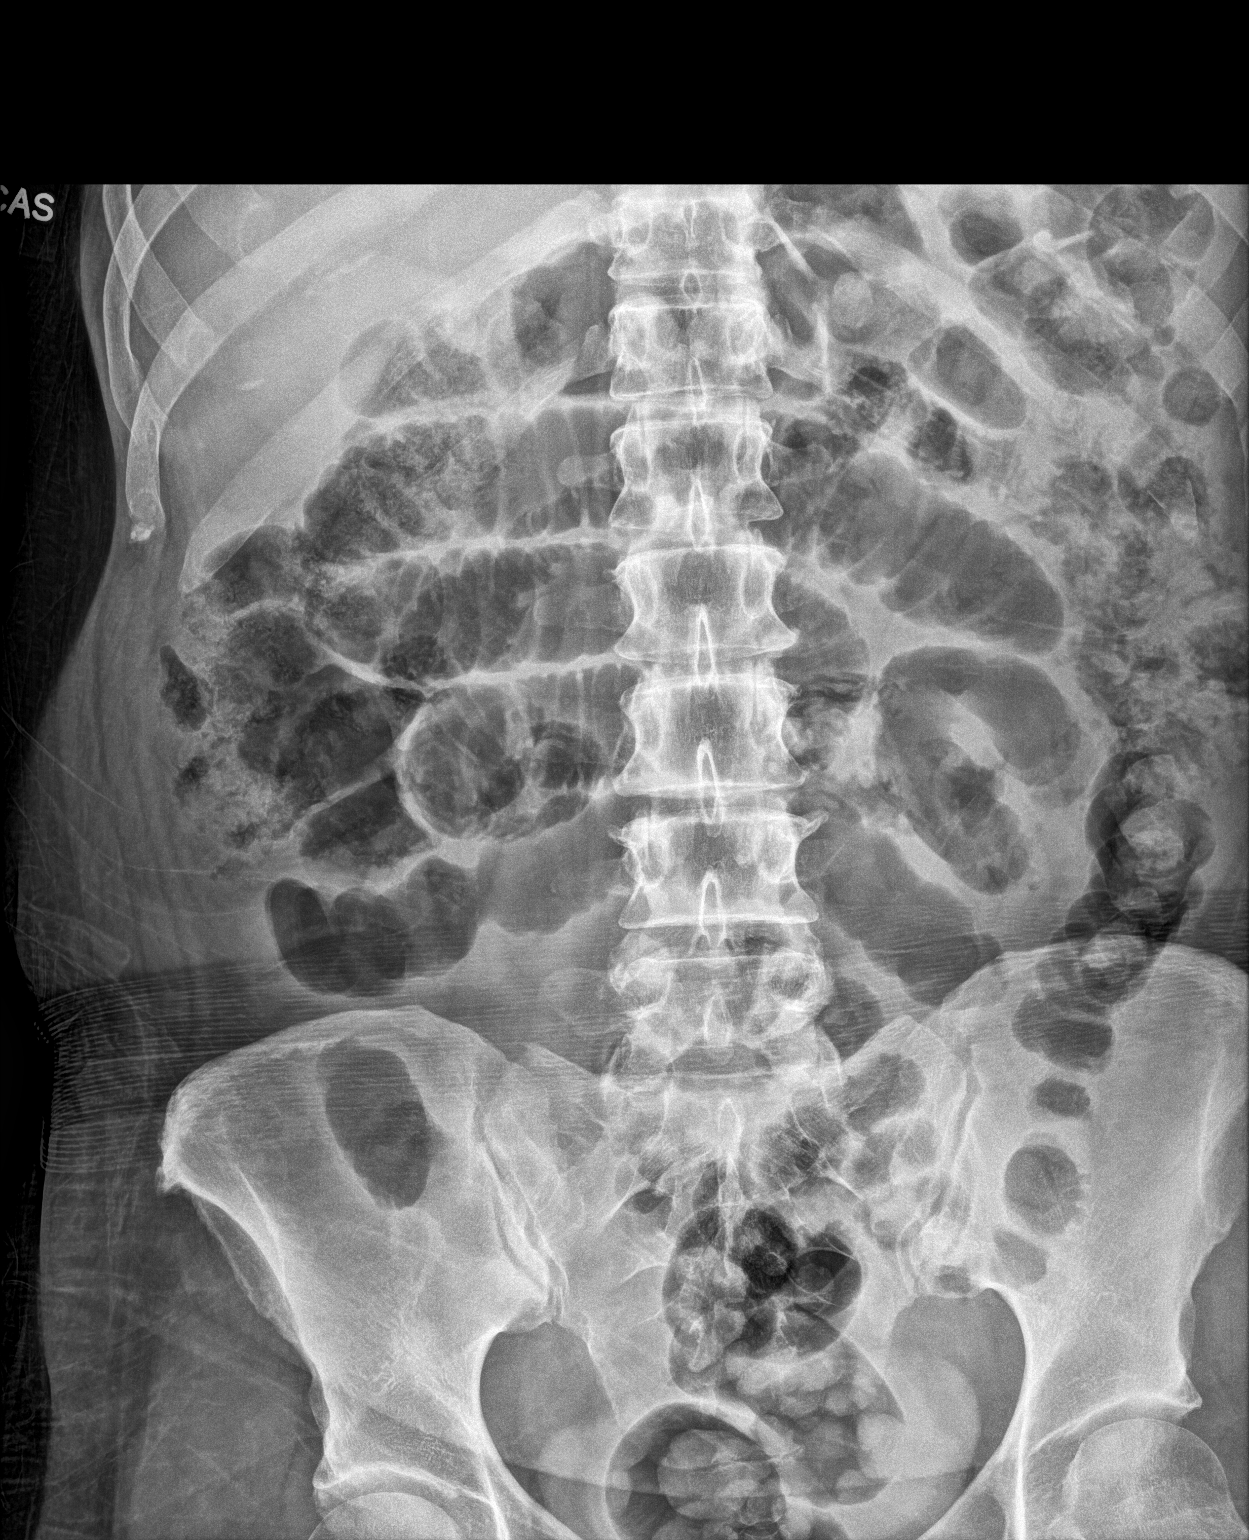

[abdomen supine (2 of 2)]
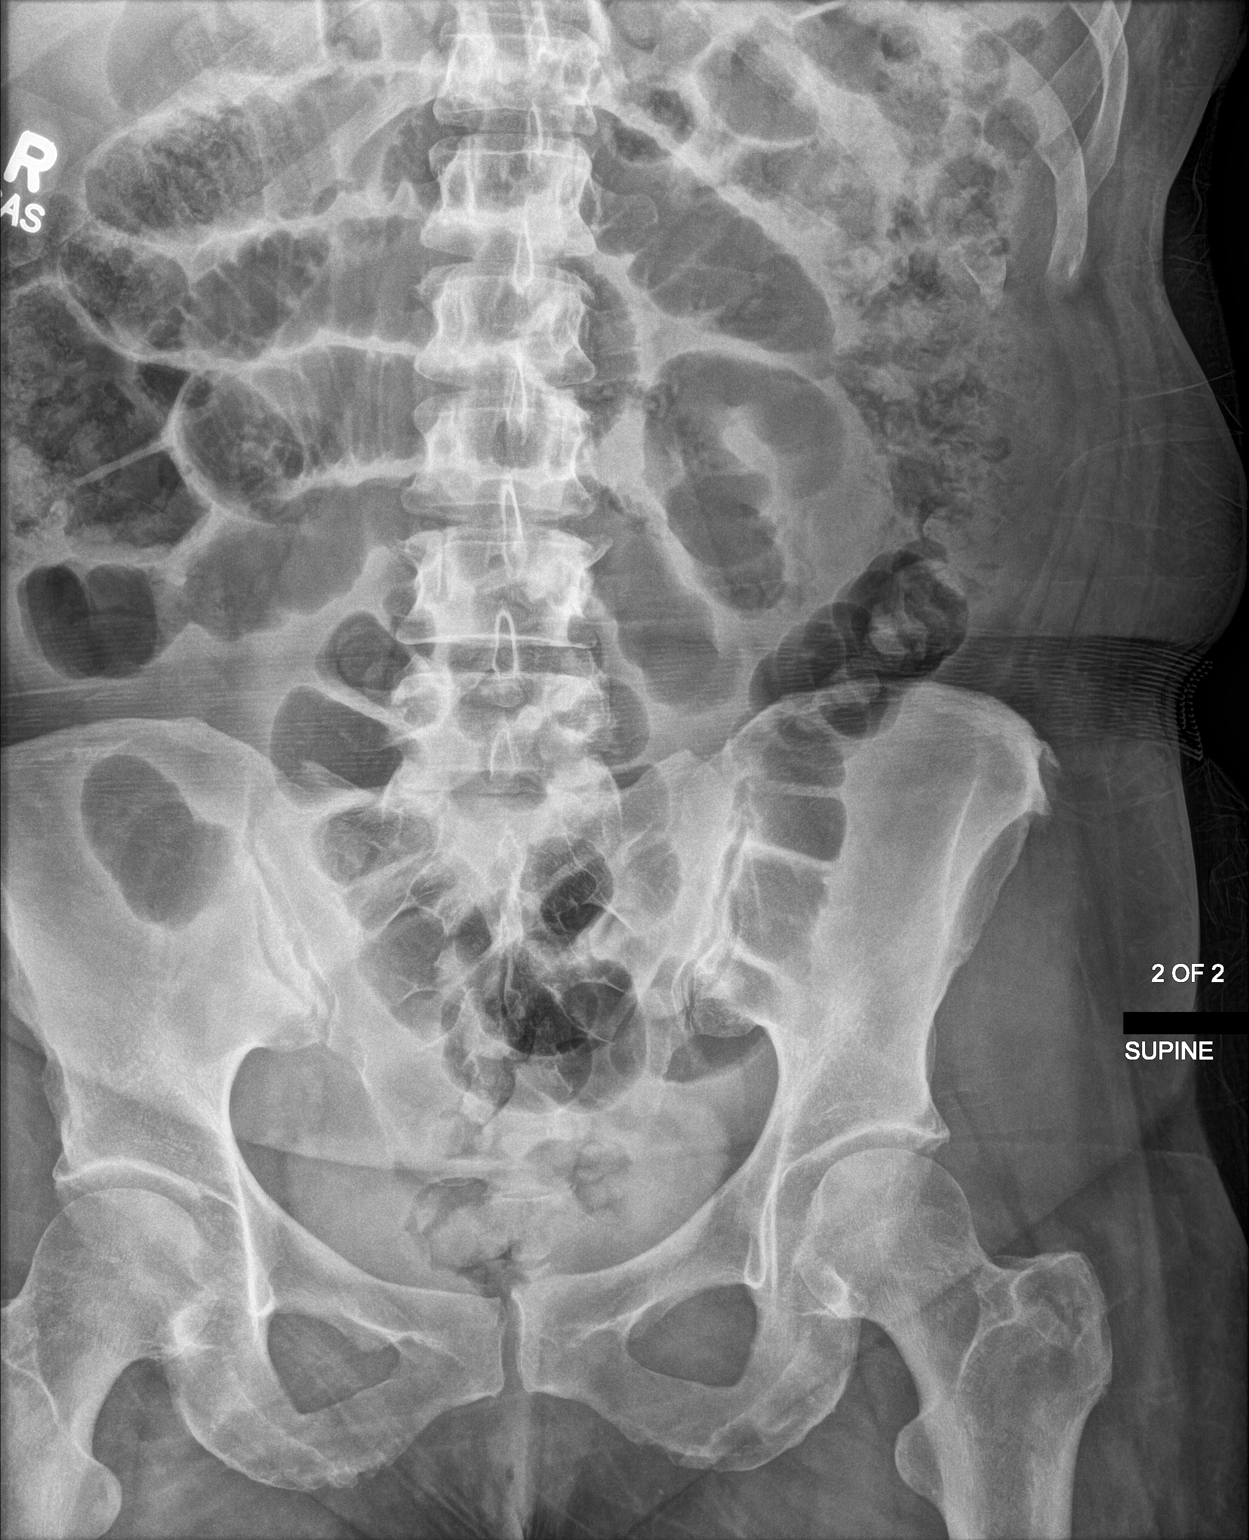

[2 of 2 positions shown; findings below may reference images not displayed]

FINDINGS: Nasogastric tube tip is in the stomach with the side port at the
gastroesophageal junction. There are several loops of mildly dilated
small bowel without air-fluid levels. No free air. Moderate stool in
colon. No abnormal calcifications.
IMPRESSION: Nasogastric tube tip in stomach with side port at gastroesophageal
junction. It may be prudent to consider advancing nasogastric tube
4-5 cm. Loops of dilated small bowel suggest that there may be a
degree of persistent small bowel obstruction. No free air. Moderate
stool in colon.

## 2023-10-29 ENCOUNTER — Ambulatory Visit: Admitting: Cardiology

## 2023-10-29 ENCOUNTER — Encounter: Payer: Self-pay | Admitting: Cardiology

## 2023-10-29 VITALS — BP 116/74 | HR 87 | Ht 70.0 in | Wt 239.0 lb

## 2023-10-29 DIAGNOSIS — Z1329 Encounter for screening for other suspected endocrine disorder: Secondary | ICD-10-CM

## 2023-10-29 DIAGNOSIS — M545 Low back pain, unspecified: Secondary | ICD-10-CM

## 2023-10-29 DIAGNOSIS — Z13220 Encounter for screening for lipoid disorders: Secondary | ICD-10-CM

## 2023-10-29 DIAGNOSIS — F331 Major depressive disorder, recurrent, moderate: Secondary | ICD-10-CM

## 2023-10-29 DIAGNOSIS — Z1231 Encounter for screening mammogram for malignant neoplasm of breast: Secondary | ICD-10-CM

## 2023-10-29 DIAGNOSIS — Z7689 Persons encountering health services in other specified circumstances: Secondary | ICD-10-CM | POA: Insufficient documentation

## 2023-10-29 DIAGNOSIS — F419 Anxiety disorder, unspecified: Secondary | ICD-10-CM | POA: Diagnosis not present

## 2023-10-29 DIAGNOSIS — R5383 Other fatigue: Secondary | ICD-10-CM | POA: Diagnosis not present

## 2023-10-29 DIAGNOSIS — Z713 Dietary counseling and surveillance: Secondary | ICD-10-CM | POA: Insufficient documentation

## 2023-10-29 DIAGNOSIS — Z1211 Encounter for screening for malignant neoplasm of colon: Secondary | ICD-10-CM

## 2023-10-29 DIAGNOSIS — F17210 Nicotine dependence, cigarettes, uncomplicated: Secondary | ICD-10-CM

## 2023-10-29 DIAGNOSIS — M25569 Pain in unspecified knee: Secondary | ICD-10-CM | POA: Diagnosis not present

## 2023-10-29 DIAGNOSIS — Z131 Encounter for screening for diabetes mellitus: Secondary | ICD-10-CM

## 2023-10-29 DIAGNOSIS — Z0130 Encounter for examination of blood pressure without abnormal findings: Secondary | ICD-10-CM

## 2023-10-29 NOTE — Progress Notes (Signed)
 New Patient Office Visit  Subjective   Patient ID: Cheyenne Hill, female    DOB: 07/21/1970  Age: 53 y.o. MRN: [id number]  CC:  Chief Complaint  Patient presents with   New Patient (Initial Visit)    New Patient     HPI Cheyenne Hill presents to establish care Previous Primary Care provider/office:   she does not have additional concerns to discuss today.   Patient in office to establish care. Patient states she has recently gained close to 80 lbs after losing it. Patient complains of back, knee pain due to weight gain. Discussed PHQ9 and GAD7 Patient states her anxiety and depression are related to her weight gain and feeling uncomfortable. Patient states she does not want to try antidepressant or anti anxiety medication. Patient interested in GLP1 to help with weight loss. Will discuss at next visit pending lab results.  Patient not fasting, will return for lab work. Due for mammogram, order sent. Due for colon cancer screening, will send order for Cologuard.     No outpatient encounter medications on file as of 10/29/2023.   No facility-administered encounter medications on file as of 10/29/2023.    Past Medical History:  Diagnosis Date   Palpitations     Past Surgical History:  Procedure Laterality Date   ABDOMINAL HYSTERECTOMY      Family History  Problem Relation Age of Onset   Hypertension Mother     Social History   Socioeconomic History   Marital status: Married    Spouse name: Not on file   Number of children: Not on file   Years of education: Not on file   Highest education level: Not on file  Occupational History   Not on file  Tobacco Use   Smoking status: Every Day   Smokeless tobacco: Never  Substance and Sexual Activity   Alcohol use: Yes    Comment: social   Drug use: Not Currently   Sexual activity: Not on file  Other Topics Concern   Not on file  Social History Narrative   Not on file   Social Drivers of Health   Financial  Resource Strain: Not on file  Food Insecurity: Not on file  Transportation Needs: Not on file  Physical Activity: Not on file  Stress: Not on file  Social Connections: Not on file  Intimate Partner Violence: Not on file    Review of Systems  Constitutional: Negative.   HENT: Negative.    Eyes: Negative.   Respiratory: Negative.  Negative for shortness of breath.   Cardiovascular: Negative.  Negative for chest pain.  Gastrointestinal: Negative.  Negative for abdominal pain, constipation and diarrhea.  Genitourinary: Negative.   Musculoskeletal:  Negative for joint pain and myalgias.  Skin: Negative.   Neurological: Negative.  Negative for dizziness and headaches.  Endo/Heme/Allergies: Negative.   Psychiatric/Behavioral:  Positive for depression. The patient is nervous/anxious.   All other systems reviewed and are negative.     Objective   BP 116/74   Pulse 87   Ht 5\' 10"  (1.778 m)   Wt 239 lb (108.4 kg)   SpO2 96%   BMI 34.29 kg/m   Physical Exam Vitals and nursing note reviewed.  Constitutional:      Appearance: Normal appearance. She is normal weight.  HENT:     Head: Normocephalic and atraumatic.     Nose: Nose normal.     Mouth/Throat:     Mouth: Mucous membranes are moist.  Eyes:  Extraocular Movements: Extraocular movements intact.     Conjunctiva/sclera: Conjunctivae normal.     Pupils: Pupils are equal, round, and reactive to light.  Cardiovascular:     Rate and Rhythm: Normal rate and regular rhythm.     Pulses: Normal pulses.     Heart sounds: Normal heart sounds.  Pulmonary:     Effort: Pulmonary effort is normal.     Breath sounds: Normal breath sounds.  Abdominal:     General: Abdomen is flat. Bowel sounds are normal.     Palpations: Abdomen is soft.  Musculoskeletal:        General: Normal range of motion.     Cervical back: Normal range of motion.  Skin:    General: Skin is warm and dry.  Neurological:     General: No focal deficit  present.     Mental Status: She is alert and oriented to person, place, and time.  Psychiatric:        Mood and Affect: Mood normal.        Behavior: Behavior normal.        Thought Content: Thought content normal.        Judgment: Judgment normal.       10/29/2023    9:14 AM  GAD 7 : Generalized Anxiety Score  Nervous, Anxious, on Edge 2  Control/stop worrying 2  Worry too much - different things 2  Trouble relaxing 2  Restless 1  Easily annoyed or irritable 0  Afraid - awful might happen 3  Total GAD 7 Score 12    Flowsheet Row Office Visit from 10/29/2023 in [facility]  Thoughts that you would be better off dead, or of hurting yourself in some way Not at all  PHQ-9 Total Score 15          Assessment & Plan:  Return for fasting lab work.  Mammogram order sent. Cologuard order sent.   Problem List Items Addressed This Visit       Other   Nicotine dependence   Encounter to establish care - Primary   Anxiety   Moderate episode of recurrent major depressive disorder (HCC)   Other Visit Diagnoses       Breast cancer screening by mammogram         Colon cancer screening           Return in about 2 weeks (around 11/12/2023).   Total time spent: 25 minutes  [employer], NP  10/29/2023   This document may have been prepared by Dragon Voice Recognition software and as such may include unintentional dictation errors.

## 2023-11-03 ENCOUNTER — Other Ambulatory Visit

## 2023-11-04 ENCOUNTER — Telehealth: Payer: Self-pay | Admitting: Cardiology

## 2023-11-04 ENCOUNTER — Other Ambulatory Visit: Payer: Self-pay | Admitting: Cardiology

## 2023-11-04 LAB — CBC WITH DIFFERENTIAL/PLATELET
Basophils Absolute: 0.1 10*3/uL (ref 0.0–0.2)
Basos: 1 %
EOS (ABSOLUTE): 0.3 10*3/uL (ref 0.0–0.4)
Eos: 4 %
Hematocrit: 43 % (ref 34.0–46.6)
Hemoglobin: 13.9 g/dL (ref 11.1–15.9)
Immature Grans (Abs): 0 10*3/uL (ref 0.0–0.1)
Immature Granulocytes: 0 %
Lymphocytes Absolute: 2.1 10*3/uL (ref 0.7–3.1)
Lymphs: 29 %
MCH: 29.3 pg (ref 26.6–33.0)
MCHC: 32.3 g/dL (ref 31.5–35.7)
MCV: 91 fL (ref 79–97)
Monocytes Absolute: 0.5 10*3/uL (ref 0.1–0.9)
Monocytes: 7 %
Neutrophils Absolute: 4.5 10*3/uL (ref 1.4–7.0)
Neutrophils: 59 %
Platelets: 278 10*3/uL (ref 150–450)
RBC: 4.75 x10E6/uL (ref 3.77–5.28)
RDW: 12.2 % (ref 11.7–15.4)
WBC: 7.5 10*3/uL (ref 3.4–10.8)

## 2023-11-04 LAB — CMP14+EGFR
ALT: 19 IU/L (ref 0–32)
AST: 14 IU/L (ref 0–40)
Albumin: 4.4 g/dL (ref 3.8–4.9)
Alkaline Phosphatase: 61 IU/L (ref 44–121)
BUN/Creatinine Ratio: 26 — ABNORMAL HIGH (ref 9–23)
BUN: 18 mg/dL (ref 6–24)
Bilirubin Total: 0.4 mg/dL (ref 0.0–1.2)
CO2: 21 mmol/L (ref 20–29)
Calcium: 9.3 mg/dL (ref 8.7–10.2)
Chloride: 105 mmol/L (ref 96–106)
Creatinine, Ser: 0.68 mg/dL (ref 0.57–1.00)
Globulin, Total: 2 g/dL (ref 1.5–4.5)
Glucose: 89 mg/dL (ref 70–99)
Potassium: 4.3 mmol/L (ref 3.5–5.2)
Sodium: 143 mmol/L (ref 134–144)
Total Protein: 6.4 g/dL (ref 6.0–8.5)
eGFR: 105 mL/min/{1.73_m2} (ref >59)

## 2023-11-04 LAB — HEMOGLOBIN A1C
Est. average glucose Bld gHb Est-mCnc: 117 mg/dL
Hgb A1c MFr Bld: 5.7 % — ABNORMAL HIGH (ref 4.8–5.6)

## 2023-11-04 LAB — LIPID PANEL
Chol/HDL Ratio: 2.9 ratio (ref 0.0–4.4)
Cholesterol, Total: 164 mg/dL (ref 100–199)
HDL: 57 mg/dL (ref >39)
LDL Chol Calc (NIH): 87 mg/dL (ref 0–99)
Triglycerides: 112 mg/dL (ref 0–149)
VLDL Cholesterol Cal: 20 mg/dL (ref 5–40)

## 2023-11-04 LAB — VITAMIN B12: Vitamin B-12: 671 pg/mL (ref 232–1245)

## 2023-11-04 LAB — TSH: TSH: 6.46 u[IU]/mL — ABNORMAL HIGH (ref 0.450–4.500)

## 2023-11-04 LAB — VITAMIN D 25 HYDROXY (VIT D DEFICIENCY, FRACTURES): Vit D, 25-Hydroxy: 28.9 ng/mL — ABNORMAL LOW (ref 30.0–100.0)

## 2023-11-04 MED ORDER — VITAMIN D (ERGOCALCIFEROL) 1.25 MG (50000 UNIT) PO CAPS: 50000.0000 [IU] | ORAL_CAPSULE | ORAL | 6 refills | Status: AC

## 2023-11-04 NOTE — Telephone Encounter (Signed)
 Patient left VM requesting her lab results. Please call back with results.

## 2023-11-05 LAB — T3: T3, Total: 129 ng/dL (ref 71–180)

## 2023-11-05 LAB — T4: T4, Total: 7 ug/dL (ref 4.5–12.0)

## 2023-11-05 LAB — SPECIMEN STATUS REPORT

## 2023-11-05 NOTE — Telephone Encounter (Signed)
 The results were in the nurse inbox, someone should have called her yesterday with those results, ill make sure she gets informed  today

## 2023-11-12 ENCOUNTER — Ambulatory Visit: Admitting: Cardiology

## 2023-11-12 ENCOUNTER — Encounter: Payer: Self-pay | Admitting: Cardiology

## 2023-11-12 VITALS — BP 154/72 | HR 86 | Ht 70.0 in | Wt 238.0 lb

## 2023-11-12 DIAGNOSIS — R7303 Prediabetes: Secondary | ICD-10-CM | POA: Diagnosis not present

## 2023-11-12 DIAGNOSIS — Z713 Dietary counseling and surveillance: Secondary | ICD-10-CM

## 2023-11-12 DIAGNOSIS — Z0130 Encounter for examination of blood pressure without abnormal findings: Secondary | ICD-10-CM

## 2023-11-12 MED ORDER — WEGOVY 0.25 MG/0.5ML ~~LOC~~ SOAJ: 0.2500 mg | SUBCUTANEOUS | 3 refills | Status: DC

## 2023-11-12 NOTE — Progress Notes (Unsigned)
 Established Patient Office Visit  Subjective:  Patient ID: Cheyenne Hill, female    DOB: 11-17-1970  Age: 53 y.o. MRN: [id number]  Chief Complaint  Patient presents with   Follow-up    2 Weeks Follow Up    Patient in office for 2 week follow up, discuss recent lab results. Patient continues to experience extreme fatigue.  Discussed recent lab work. TSH low, normal T3 and T4. Will recheck at next visit.  Vitamin D  low, started on weekly supplementation.  Normal CBC and B12.  Hgb A1c elevated, pre diabetic. Patient interested in trying Wegovy . Will send to pharmacy.     No other concerns at this time.   Past Medical History:  Diagnosis Date   Palpitations     Past Surgical History:  Procedure Laterality Date   ABDOMINAL HYSTERECTOMY      Social History   Socioeconomic History   Marital status: Married    Spouse name: Not on file   Number of children: Not on file   Years of education: Not on file   Highest education level: Not on file  Occupational History   Not on file  Tobacco Use   Smoking status: Every Day   Smokeless tobacco: Never  Substance and Sexual Activity   Alcohol use: Yes    Comment: social   Drug use: Not Currently   Sexual activity: Not on file  Other Topics Concern   Not on file  Social History Narrative   Not on file   Social Drivers of Health   Financial Resource Strain: Not on file  Food Insecurity: Not on file  Transportation Needs: Not on file  Physical Activity: Not on file  Stress: Not on file  Social Connections: Not on file  Intimate Partner Violence: Not on file    Family History  Problem Relation Age of Onset   Hypertension Mother     No Known Allergies  Outpatient Medications Prior to Visit  Medication Sig   Vitamin D , Ergocalciferol , (DRISDOL ) 1.25 MG (50000 UNIT) CAPS capsule Take 1 capsule (50,000 Units total) by mouth every 7 (seven) days.   No facility-administered medications prior to visit.    ROS      Objective:   BP (!) 154/72   Pulse 86   Ht 5\' 10"  (1.778 m)   Wt 238 lb (108 kg)   SpO2 96%   BMI 34.15 kg/m   Vitals:   11/12/23 0855  BP: (!) 154/72  Pulse: 86  Height: 5\' 10"  (1.778 m)  Weight: 238 lb (108 kg)  SpO2: 96%  BMI (Calculated): 34.15    Physical Exam   No results found for any visits on 11/12/23.  Recent Results (from the past 2160 hours)  Lipid Profile     Status: None   Collection Time: 11/03/23  8:31 AM  Result Value Ref Range   Cholesterol, Total 164 100 - 199 mg/dL   Triglycerides 962 0 - 149 mg/dL   HDL 57 >95 mg/dL   VLDL Cholesterol Cal 20 5 - 40 mg/dL   LDL Chol Calc (NIH) 87 0 - 99 mg/dL   Chol/HDL Ratio 2.9 0.0 - 4.4 ratio    Comment:                                   T. Chol/HDL Ratio  Men  Women                               1/2 Avg.Risk  3.4    3.3                                   Avg.Risk  5.0    4.4                                2X Avg.Risk  9.6    7.1                                3X Avg.Risk 23.4   11.0   CMP14+EGFR     Status: Abnormal   Collection Time: 11/03/23  8:31 AM  Result Value Ref Range   Glucose 89 70 - 99 mg/dL   BUN 18 6 - 24 mg/dL   Creatinine, Ser 1.61 0.57 - 1.00 mg/dL   eGFR 096 >04 VW/UJW/1.19   BUN/Creatinine Ratio 26 (H) 9 - 23   Sodium 143 134 - 144 mmol/L   Potassium 4.3 3.5 - 5.2 mmol/L   Chloride 105 96 - 106 mmol/L   CO2 21 20 - 29 mmol/L   Calcium 9.3 8.7 - 10.2 mg/dL   Total Protein 6.4 6.0 - 8.5 g/dL   Albumin 4.4 3.8 - 4.9 g/dL   Globulin, Total 2.0 1.5 - 4.5 g/dL   Bilirubin Total 0.4 0.0 - 1.2 mg/dL   Alkaline Phosphatase 61 44 - 121 IU/L   AST 14 0 - 40 IU/L   ALT 19 0 - 32 IU/L  Hemoglobin A1c     Status: Abnormal   Collection Time: 11/03/23  8:31 AM  Result Value Ref Range   Hgb A1c MFr Bld 5.7 (H) 4.8 - 5.6 %    Comment:          Prediabetes: 5.7 - 6.4          Diabetes: >6.4          Glycemic control for adults with diabetes: <7.0     Est. average glucose Bld gHb Est-mCnc 117 mg/dL  Vitamin D  (25 hydroxy)     Status: Abnormal   Collection Time: 11/03/23  8:31 AM  Result Value Ref Range   Vit D, 25-Hydroxy 28.9 (L) 30.0 - 100.0 ng/mL    Comment: Vitamin D  deficiency has been defined by the Institute of Medicine and an Endocrine Society practice guideline as a level of serum 25-OH vitamin D  less than 20 ng/mL (1,2). The Endocrine Society went on to further define vitamin D  insufficiency as a level between 21 and 29 ng/mL (2). 1. IOM (Institute of Medicine). 2010. Dietary reference    intakes for calcium and D. [place]: The    [employer]. 2. Holick MF, Binkley [place], Bischoff-Ferrari HA, et al.    Evaluation, treatment, and prevention of vitamin D     deficiency: an Endocrine Society clinical practice    guideline. JCEM. 2011 Jul; 96(7):1911-30.   Vitamin B12     Status: None   Collection Time: 11/03/23  8:31 AM  Result Value Ref Range   Vitamin B-12 671 232 - 1,245 pg/mL  CBC with Differential/Platelet  Status: None   Collection Time: 11/03/23  8:31 AM  Result Value Ref Range   WBC 7.5 3.4 - 10.8 x10E3/uL   RBC 4.75 3.77 - 5.28 x10E6/uL   Hemoglobin 13.9 11.1 - 15.9 g/dL   Hematocrit 40.1 02.7 - 46.6 %   MCV 91 79 - 97 fL   MCH 29.3 26.6 - 33.0 pg   MCHC 32.3 31.5 - 35.7 g/dL   RDW 25.3 66.4 - 40.3 %   Platelets 278 150 - 450 x10E3/uL   Neutrophils 59 Not Estab. %   Lymphs 29 Not Estab. %   Monocytes 7 Not Estab. %   Eos 4 Not Estab. %   Basos 1 Not Estab. %   Neutrophils Absolute 4.5 1.4 - 7.0 x10E3/uL   Lymphocytes Absolute 2.1 0.7 - 3.1 x10E3/uL   Monocytes Absolute 0.5 0.1 - 0.9 x10E3/uL   EOS (ABSOLUTE) 0.3 0.0 - 0.4 x10E3/uL   Basophils Absolute 0.1 0.0 - 0.2 x10E3/uL   Immature Granulocytes 0 Not Estab. %   Immature Grans (Abs) 0.0 0.0 - 0.1 x10E3/uL  TSH     Status: Abnormal   Collection Time: 11/03/23  8:31 AM  Result Value Ref Range   TSH 6.460 (H) 0.450 - 4.500 uIU/mL   T4     Status: None   Collection Time: 11/03/23  8:31 AM  Result Value Ref Range   T4, Total 7.0 4.5 - 12.0 ug/dL  T3     Status: None   Collection Time: 11/03/23  8:31 AM  Result Value Ref Range   T3, Total 129 71 - 180 ng/dL  Specimen status report     Status: None   Collection Time: 11/03/23  8:31 AM  Result Value Ref Range   specimen status report Comment     Comment: Written Authorization Written Authorization Written Authorization Received. Authorization received from Carver Clark for [employer] on 11-04-2023 Logged by Criselda Dolly       Assessment & Plan:  Vitamin D  supplement Wegovy  for weight loss  Problem List Items Addressed This Visit       Other   Weight loss counseling, encounter for   Prediabetes - Primary    Return in about 6 weeks (around 12/24/2023).   Total time spent: 25 minutes  Google, NP  11/12/2023   This document may have been prepared by Dragon Voice Recognition software and as such may include unintentional dictation errors.

## 2023-11-19 ENCOUNTER — Encounter: Payer: Self-pay | Admitting: Cardiology

## 2023-11-19 DIAGNOSIS — R7303 Prediabetes: Secondary | ICD-10-CM | POA: Insufficient documentation

## 2023-12-16 ENCOUNTER — Encounter: Payer: Self-pay | Admitting: Cardiology

## 2023-12-17 ENCOUNTER — Other Ambulatory Visit: Payer: Self-pay | Admitting: Cardiology

## 2023-12-17 MED ORDER — WEGOVY 0.25 MG/0.5ML ~~LOC~~ SOAJ: 0.5000 mg | SUBCUTANEOUS | 3 refills | Status: DC

## 2023-12-20 ENCOUNTER — Other Ambulatory Visit: Payer: Self-pay | Admitting: Cardiology

## 2023-12-21 ENCOUNTER — Other Ambulatory Visit: Payer: Self-pay

## 2023-12-21 MED ORDER — WEGOVY 0.5 MG/0.5ML ~~LOC~~ SOAJ: 0.5000 mg | SUBCUTANEOUS | 1 refills | Status: DC

## 2023-12-24 ENCOUNTER — Encounter: Payer: Self-pay | Admitting: Cardiology

## 2023-12-24 ENCOUNTER — Ambulatory Visit: Admitting: Cardiology

## 2023-12-24 VITALS — BP 126/84 | HR 73 | Ht 70.0 in | Wt 240.0 lb

## 2023-12-24 DIAGNOSIS — R5383 Other fatigue: Secondary | ICD-10-CM | POA: Diagnosis not present

## 2023-12-24 DIAGNOSIS — E66811 Obesity, class 1: Secondary | ICD-10-CM | POA: Diagnosis not present

## 2023-12-24 DIAGNOSIS — Z713 Dietary counseling and surveillance: Secondary | ICD-10-CM

## 2023-12-24 DIAGNOSIS — E6609 Other obesity due to excess calories: Secondary | ICD-10-CM

## 2023-12-24 DIAGNOSIS — Z6834 Body mass index (BMI) 34.0-34.9, adult: Secondary | ICD-10-CM

## 2023-12-24 DIAGNOSIS — E559 Vitamin D deficiency, unspecified: Secondary | ICD-10-CM | POA: Insufficient documentation

## 2023-12-24 DIAGNOSIS — Z0130 Encounter for examination of blood pressure without abnormal findings: Secondary | ICD-10-CM

## 2023-12-24 MED ORDER — HYDROXYZINE HCL 10 MG PO TABS: 10.0000 mg | ORAL_TABLET | Freq: Three times a day (TID) | ORAL | 0 refills | Status: AC | PRN

## 2023-12-24 NOTE — Progress Notes (Signed)
 Established Patient Office Visit  Subjective:  Patient ID: Cheyenne Hill, female    DOB: 08/09/1970  Age: 53 y.o. MRN: [id number]  Chief Complaint  Patient presents with   Follow-up    6 weeks follow up    Patient in office for 6 week follow up. Patient taking and tolerating Wegovy , will increase to 0.5 mg with today's injection. Patient will reach out via MyChart in 4 weeks, possibly increase dose. TSH elevated in April 2025, will recheck. Patient requests to return next week for labs.  Patient traveling to [place] via plane tomorrow, reports being anxious. Will send in hydroxyzine. Continue same medications.     No other concerns at this time.   Past Medical History:  Diagnosis Date   Palpitations     Past Surgical History:  Procedure Laterality Date   ABDOMINAL HYSTERECTOMY      Social History   Socioeconomic History   Marital status: Married    Spouse name: Not on file   Number of children: Not on file   Years of education: Not on file   Highest education level: Not on file  Occupational History   Not on file  Tobacco Use   Smoking status: Every Day   Smokeless tobacco: Never  Substance and Sexual Activity   Alcohol use: Yes    Comment: social   Drug use: Not Currently   Sexual activity: Not on file  Other Topics Concern   Not on file  Social History Narrative   Not on file   Social Drivers of Health   Financial Resource Strain: Not on file  Food Insecurity: Not on file  Transportation Needs: Not on file  Physical Activity: Not on file  Stress: Not on file  Social Connections: Not on file  Intimate Partner Violence: Not on file    Family History  Problem Relation Age of Onset   Hypertension Mother     No Known Allergies  Outpatient Medications Prior to Visit  Medication Sig   Semaglutide -Weight Management (WEGOVY ) 0.5 MG/0.5ML SOAJ Inject 0.5 mg into the skin once a week.   Vitamin D , Ergocalciferol , (DRISDOL ) 1.25 MG (50000 UNIT) CAPS  capsule Take 1 capsule (50,000 Units total) by mouth every 7 (seven) days.   No facility-administered medications prior to visit.    Review of Systems  Constitutional: Negative.   HENT: Negative.    Eyes: Negative.   Respiratory: Negative.  Negative for shortness of breath.   Cardiovascular: Negative.  Negative for chest pain.  Gastrointestinal: Negative.  Negative for abdominal pain, constipation and diarrhea.  Genitourinary: Negative.   Musculoskeletal:  Negative for joint pain and myalgias.  Skin: Negative.   Neurological: Negative.  Negative for dizziness and headaches.  Endo/Heme/Allergies: Negative.   All other systems reviewed and are negative.      Objective:   BP 126/84   Pulse 73   Ht 5' 10 (1.778 m)   Wt 240 lb (108.9 kg)   SpO2 94%   BMI 34.44 kg/m   Vitals:   12/24/23 0908  BP: 126/84  Pulse: 73  Height: 5' 10 (1.778 m)  Weight: 240 lb (108.9 kg)  SpO2: 94%  BMI (Calculated): 34.44    Physical Exam Vitals and nursing note reviewed.  Constitutional:      Appearance: Normal appearance. She is normal weight.  HENT:     Head: Normocephalic and atraumatic.     Nose: Nose normal.     Mouth/Throat:     Mouth:  Mucous membranes are moist.   Eyes:     Extraocular Movements: Extraocular movements intact.     Conjunctiva/sclera: Conjunctivae normal.     Pupils: Pupils are equal, round, and reactive to light.    Cardiovascular:     Rate and Rhythm: Normal rate and regular rhythm.     Pulses: Normal pulses.     Heart sounds: Normal heart sounds.  Pulmonary:     Effort: Pulmonary effort is normal.     Breath sounds: Normal breath sounds.  Abdominal:     General: Abdomen is flat. Bowel sounds are normal.     Palpations: Abdomen is soft.   Musculoskeletal:        General: Normal range of motion.     Cervical back: Normal range of motion.   Skin:    General: Skin is warm and dry.   Neurological:     General: No focal deficit present.      Mental Status: She is alert and oriented to person, place, and time.   Psychiatric:        Mood and Affect: Mood normal.        Behavior: Behavior normal.        Thought Content: Thought content normal.        Judgment: Judgment normal.      No results found for any visits on 12/24/23.  Recent Results (from the past 2160 hours)  Lipid Profile     Status: None   Collection Time: 11/03/23  8:31 AM  Result Value Ref Range   Cholesterol, Total 164 100 - 199 mg/dL   Triglycerides 161 0 - 149 mg/dL   HDL 57 >09 mg/dL   VLDL Cholesterol Cal 20 5 - 40 mg/dL   LDL Chol Calc (NIH) 87 0 - 99 mg/dL   Chol/HDL Ratio 2.9 0.0 - 4.4 ratio    Comment:                                   T. Chol/HDL Ratio                                             Men  Women                               1/2 Avg.Risk  3.4    3.3                                   Avg.Risk  5.0    4.4                                2X Avg.Risk  9.6    7.1                                3X Avg.Risk 23.4   11.0   CMP14+EGFR     Status: Abnormal   Collection Time: 11/03/23  8:31 AM  Result Value Ref Range   Glucose 89 70 - 99 mg/dL   BUN 18 6 - 24 mg/dL  Creatinine, Ser 0.68 0.57 - 1.00 mg/dL   eGFR 161 >09 UE/AVW/0.98   BUN/Creatinine Ratio 26 (H) 9 - 23   Sodium 143 134 - 144 mmol/L   Potassium 4.3 3.5 - 5.2 mmol/L   Chloride 105 96 - 106 mmol/L   CO2 21 20 - 29 mmol/L   Calcium 9.3 8.7 - 10.2 mg/dL   Total Protein 6.4 6.0 - 8.5 g/dL   Albumin 4.4 3.8 - 4.9 g/dL   Globulin, Total 2.0 1.5 - 4.5 g/dL   Bilirubin Total 0.4 0.0 - 1.2 mg/dL   Alkaline Phosphatase 61 44 - 121 IU/L   AST 14 0 - 40 IU/L   ALT 19 0 - 32 IU/L  Hemoglobin A1c     Status: Abnormal   Collection Time: 11/03/23  8:31 AM  Result Value Ref Range   Hgb A1c MFr Bld 5.7 (H) 4.8 - 5.6 %    Comment:          Prediabetes: 5.7 - 6.4          Diabetes: >6.4          Glycemic control for adults with diabetes: <7.0    Est. average glucose Bld gHb Est-mCnc  117 mg/dL  Vitamin D  (25 hydroxy)     Status: Abnormal   Collection Time: 11/03/23  8:31 AM  Result Value Ref Range   Vit D, 25-Hydroxy 28.9 (L) 30.0 - 100.0 ng/mL    Comment: Vitamin D  deficiency has been defined by the Institute of Medicine and an Endocrine Society practice guideline as a level of serum 25-OH vitamin D  less than 20 ng/mL (1,2). The Endocrine Society went on to further define vitamin D  insufficiency as a level between 21 and 29 ng/mL (2). 1. IOM (Institute of Medicine). 2010. Dietary reference    intakes for calcium and D. [place]: The    [employer]. 2. Holick MF, Binkley [place], Bischoff-Ferrari HA, et al.    Evaluation, treatment, and prevention of vitamin D     deficiency: an Endocrine Society clinical practice    guideline. JCEM. 2011 Jul; 96(7):1911-30.   Vitamin B12     Status: None   Collection Time: 11/03/23  8:31 AM  Result Value Ref Range   Vitamin B-12 671 232 - 1,245 pg/mL  CBC with Differential/Platelet     Status: None   Collection Time: 11/03/23  8:31 AM  Result Value Ref Range   WBC 7.5 3.4 - 10.8 x10E3/uL   RBC 4.75 3.77 - 5.28 x10E6/uL   Hemoglobin 13.9 11.1 - 15.9 g/dL   Hematocrit 11.9 14.7 - 46.6 %   MCV 91 79 - 97 fL   MCH 29.3 26.6 - 33.0 pg   MCHC 32.3 31.5 - 35.7 g/dL   RDW 82.9 56.2 - 13.0 %   Platelets 278 150 - 450 x10E3/uL   Neutrophils 59 Not Estab. %   Lymphs 29 Not Estab. %   Monocytes 7 Not Estab. %   Eos 4 Not Estab. %   Basos 1 Not Estab. %   Neutrophils Absolute 4.5 1.4 - 7.0 x10E3/uL   Lymphocytes Absolute 2.1 0.7 - 3.1 x10E3/uL   Monocytes Absolute 0.5 0.1 - 0.9 x10E3/uL   EOS (ABSOLUTE) 0.3 0.0 - 0.4 x10E3/uL   Basophils Absolute 0.1 0.0 - 0.2 x10E3/uL   Immature Granulocytes 0 Not Estab. %   Immature Grans (Abs) 0.0 0.0 - 0.1 x10E3/uL  TSH     Status: Abnormal   Collection Time: 11/03/23  8:31 AM  Result Value Ref Range   TSH 6.460 (H) 0.450 - 4.500 uIU/mL  T4     Status: None   Collection  Time: 11/03/23  8:31 AM  Result Value Ref Range   T4, Total 7.0 4.5 - 12.0 ug/dL  T3     Status: None   Collection Time: 11/03/23  8:31 AM  Result Value Ref Range   T3, Total 129 71 - 180 ng/dL  Specimen status report     Status: None   Collection Time: 11/03/23  8:31 AM  Result Value Ref Range   specimen status report Comment     Comment: Written Authorization Written Authorization Written Authorization Received. Authorization received from Carver Clark for [employer] on 11-04-2023 Logged by Criselda Dolly       Assessment & Plan:  Recheck TSH next week. Hydroxyzine for anxiety Continue same medications  Problem List Items Addressed This Visit       Other   Weight loss counseling, encounter for   Class 1 obesity due to excess calories without serious comorbidity with body mass index (BMI) of 34.0 to 34.9 in adult - Primary   Vitamin D  deficiency   Other Visit Diagnoses       Other fatigue       Relevant Orders   TSH       Return in about 3 months (around 03/25/2024) for fasting labs prior.   Total time spent: 25 minutes  Google, NP  12/24/2023   This document may have been prepared by Dragon Voice Recognition software and as such may include unintentional dictation errors.

## 2024-02-08 ENCOUNTER — Other Ambulatory Visit: Payer: Self-pay

## 2024-02-08 ENCOUNTER — Encounter: Payer: Self-pay | Admitting: Cardiology

## 2024-02-08 MED ORDER — WEGOVY 1 MG/0.5ML ~~LOC~~ SOAJ: 1.0000 mg | SUBCUTANEOUS | 1 refills | Status: DC

## 2024-04-04 ENCOUNTER — Ambulatory Visit: Admitting: Cardiology

## 2024-04-12 ENCOUNTER — Other Ambulatory Visit: Payer: Self-pay | Admitting: Cardiology

## 2024-04-28 ENCOUNTER — Ambulatory Visit: Admitting: Cardiology

## 2024-04-28 ENCOUNTER — Encounter: Payer: Self-pay | Admitting: Cardiology

## 2024-04-28 VITALS — BP 138/86 | HR 86 | Ht 70.0 in | Wt 239.2 lb

## 2024-04-28 DIAGNOSIS — E559 Vitamin D deficiency, unspecified: Secondary | ICD-10-CM

## 2024-04-28 DIAGNOSIS — F419 Anxiety disorder, unspecified: Secondary | ICD-10-CM

## 2024-04-28 DIAGNOSIS — R5383 Other fatigue: Secondary | ICD-10-CM

## 2024-04-28 DIAGNOSIS — Z1329 Encounter for screening for other suspected endocrine disorder: Secondary | ICD-10-CM

## 2024-04-28 DIAGNOSIS — R7303 Prediabetes: Secondary | ICD-10-CM

## 2024-04-28 DIAGNOSIS — Z13220 Encounter for screening for lipoid disorders: Secondary | ICD-10-CM

## 2024-04-28 DIAGNOSIS — M79676 Pain in unspecified toe(s): Secondary | ICD-10-CM

## 2024-04-28 NOTE — Progress Notes (Signed)
 Established Patient Office Visit  Subjective:  Patient ID: Cheyenne Hill, female    DOB: 1970/11/13  Age: 53 y.o. MRN: [id number]  Chief Complaint  Patient presents with   Follow-up    3 month lab results    Patient in office for 3 month follow up, discuss recent lab results. Patient reports being under increased stress at home. Patient taking hydroxyzine  as needed.  Patient did not have lab work done. Will get fasting labs today and call with results.  Patient complains of bilateral great toe pain. Reports pain started about a year ago after attending a music festival, wearing cowboy boots all day. Patient reports toe pain is worse depending on what shoes she wears. Patient states pain worse the higher the heal. Will refer to podiatry.  Continue same medications.    No other concerns at this time.   Past Medical History:  Diagnosis Date   Palpitations     Past Surgical History:  Procedure Laterality Date   ABDOMINAL HYSTERECTOMY      Social History   Socioeconomic History   Marital status: Married    Spouse name: Not on file   Number of children: Not on file   Years of education: Not on file   Highest education level: Not on file  Occupational History   Not on file  Tobacco Use   Smoking status: Every Day   Smokeless tobacco: Never  Substance and Sexual Activity   Alcohol use: Yes    Comment: social   Drug use: Not Currently   Sexual activity: Not on file  Other Topics Concern   Not on file  Social History Narrative   Not on file   Social Drivers of Health   Financial Resource Strain: Not on file  Food Insecurity: Not on file  Transportation Needs: Not on file  Physical Activity: Not on file  Stress: Not on file  Social Connections: Not on file  Intimate Partner Violence: Not on file    Family History  Problem Relation Age of Onset   Hypertension Mother     No Known Allergies  Outpatient Medications Prior to Visit  Medication Sig    hydrOXYzine  (ATARAX ) 10 MG tablet Take 1 tablet (10 mg total) by mouth 3 (three) times daily as needed.   Semaglutide -Weight Management (WEGOVY ) 1 MG/0.5ML SOAJ Inject 1 mg into the skin every 7 (seven) days.   Vitamin D , Ergocalciferol , (DRISDOL ) 1.25 MG (50000 UNIT) CAPS capsule Take 1 capsule (50,000 Units total) by mouth every 7 (seven) days.   No facility-administered medications prior to visit.    Review of Systems  Constitutional: Negative.   HENT: Negative.    Eyes: Negative.   Respiratory: Negative.  Negative for shortness of breath.   Cardiovascular: Negative.  Negative for chest pain.  Gastrointestinal: Negative.  Negative for abdominal pain, constipation and diarrhea.  Genitourinary: Negative.   Musculoskeletal:  Negative for joint pain and myalgias.  Skin: Negative.   Neurological: Negative.  Negative for dizziness and headaches.  Endo/Heme/Allergies: Negative.   All other systems reviewed and are negative.      Objective:   BP 138/86   Pulse 86   Ht 5' 10 (1.778 m)   Wt 239 lb 3.2 oz (108.5 kg)   SpO2 98%   BMI 34.32 kg/m   Vitals:   04/28/24 0908  BP: 138/86  Pulse: 86  Height: 5' 10 (1.778 m)  Weight: 239 lb 3.2 oz (108.5 kg)  SpO2: 98%  BMI (Calculated): 34.32    Physical Exam Vitals and nursing note reviewed.  Constitutional:      Appearance: Normal appearance. She is normal weight.  HENT:     Head: Normocephalic and atraumatic.     Nose: Nose normal.     Mouth/Throat:     Mouth: Mucous membranes are moist.  Eyes:     Extraocular Movements: Extraocular movements intact.     Conjunctiva/sclera: Conjunctivae normal.     Pupils: Pupils are equal, round, and reactive to light.  Cardiovascular:     Rate and Rhythm: Normal rate and regular rhythm.     Pulses: Normal pulses.     Heart sounds: Normal heart sounds.  Pulmonary:     Effort: Pulmonary effort is normal.     Breath sounds: Normal breath sounds.  Abdominal:     General: Abdomen is  flat. Bowel sounds are normal.     Palpations: Abdomen is soft.  Musculoskeletal:        General: Normal range of motion.     Cervical back: Normal range of motion.  Skin:    General: Skin is warm and dry.  Neurological:     General: No focal deficit present.     Mental Status: She is alert and oriented to person, place, and time.  Psychiatric:        Mood and Affect: Mood normal.        Behavior: Behavior normal.        Thought Content: Thought content normal.        Judgment: Judgment normal.      No results found for any visits on 04/28/24.  No results found for this or any previous visit (from the past 2160 hours).    Assessment & Plan:  Fasting lab work today Referral sent to podiatry  Problem List Items Addressed This Visit       Other   Anxiety   Relevant Orders   CBC with Differential/Platelet   Prediabetes - Primary   Relevant Orders   CMP14+EGFR   Hemoglobin A1c   Vitamin D  deficiency   Relevant Orders   Vitamin D  (25 hydroxy)   Other Visit Diagnoses       Thyroid disorder screening       Relevant Orders   TSH     Lipid screening       Relevant Orders   Lipid Profile     Other fatigue       Relevant Orders   CBC with Differential/Platelet     Pain of great toe, unspecified laterality       Relevant Orders   Ambulatory referral to Podiatry       Return in about 4 months (around 08/29/2024) for fasting labs prior.   Total time spent: 25 minutes. This time includes review of previous notes and results and patient face to face interaction during today's visit.    Jeoffrey Pollen, NP  04/28/2024   This document may have been prepared by Dragon Voice Recognition software and as such may include unintentional dictation errors.

## 2024-04-29 ENCOUNTER — Ambulatory Visit: Payer: Self-pay | Admitting: Cardiology

## 2024-04-29 LAB — CBC WITH DIFFERENTIAL/PLATELET
Basophils Absolute: 0.1 x10E3/uL (ref 0.0–0.2)
Basos: 1 %
EOS (ABSOLUTE): 0.2 x10E3/uL (ref 0.0–0.4)
Eos: 3 %
Hematocrit: 45.2 % (ref 34.0–46.6)
Hemoglobin: 14.4 g/dL (ref 11.1–15.9)
Immature Grans (Abs): 0 x10E3/uL (ref 0.0–0.1)
Immature Granulocytes: 0 %
Lymphocytes Absolute: 1.8 x10E3/uL (ref 0.7–3.1)
Lymphs: 26 %
MCH: 29.4 pg (ref 26.6–33.0)
MCHC: 31.9 g/dL (ref 31.5–35.7)
MCV: 92 fL (ref 79–97)
Monocytes Absolute: 0.5 x10E3/uL (ref 0.1–0.9)
Monocytes: 7 %
Neutrophils Absolute: 4.1 x10E3/uL (ref 1.4–7.0)
Neutrophils: 63 %
Platelets: 303 x10E3/uL (ref 150–450)
RBC: 4.9 x10E6/uL (ref 3.77–5.28)
RDW: 12.5 % (ref 11.7–15.4)
WBC: 6.6 x10E3/uL (ref 3.4–10.8)

## 2024-04-29 LAB — LIPID PANEL
Chol/HDL Ratio: 2.5 ratio (ref 0.0–4.4)
Cholesterol, Total: 152 mg/dL (ref 100–199)
HDL: 60 mg/dL (ref >39)
LDL Chol Calc (NIH): 80 mg/dL (ref 0–99)
Triglycerides: 61 mg/dL (ref 0–149)
VLDL Cholesterol Cal: 12 mg/dL (ref 5–40)

## 2024-04-29 LAB — CMP14+EGFR
ALT: 17 IU/L (ref 0–32)
AST: 18 IU/L (ref 0–40)
Albumin: 4.8 g/dL (ref 3.8–4.9)
Alkaline Phosphatase: 59 IU/L (ref 49–135)
BUN/Creatinine Ratio: 28 — ABNORMAL HIGH (ref 9–23)
BUN: 19 mg/dL (ref 6–24)
Bilirubin Total: 0.5 mg/dL (ref 0.0–1.2)
CO2: 24 mmol/L (ref 20–29)
Calcium: 9.8 mg/dL (ref 8.7–10.2)
Chloride: 103 mmol/L (ref 96–106)
Creatinine, Ser: 0.67 mg/dL (ref 0.57–1.00)
Globulin, Total: 2.1 g/dL (ref 1.5–4.5)
Glucose: 94 mg/dL (ref 70–99)
Potassium: 4.7 mmol/L (ref 3.5–5.2)
Sodium: 140 mmol/L (ref 134–144)
Total Protein: 6.9 g/dL (ref 6.0–8.5)
eGFR: 105 mL/min/1.73 (ref >59)

## 2024-04-29 LAB — HEMOGLOBIN A1C
Est. average glucose Bld gHb Est-mCnc: 108 mg/dL
Hgb A1c MFr Bld: 5.4 % (ref 4.8–5.6)

## 2024-04-29 LAB — TSH: TSH: 4.24 u[IU]/mL (ref 0.450–4.500)

## 2024-04-29 LAB — VITAMIN D 25 HYDROXY (VIT D DEFICIENCY, FRACTURES): Vit D, 25-Hydroxy: 44.2 ng/mL (ref 30.0–100.0)

## 2024-05-03 NOTE — Progress Notes (Signed)
Pt informed

## 2024-08-16 ENCOUNTER — Telehealth: Payer: Self-pay

## 2024-08-16 NOTE — Telephone Encounter (Signed)
 PA was sent for Wegovy , pt needs appt for this to be done, she has not been seen since 04/2024. We will have to send new rx and have udpated weight from office visit

## 2024-08-18 ENCOUNTER — Ambulatory Visit: Admitting: Cardiology

## 2024-08-18 ENCOUNTER — Encounter: Payer: Self-pay | Admitting: Cardiology

## 2024-08-18 VITALS — BP 122/82 | HR 86 | Ht 70.0 in | Wt 240.0 lb

## 2024-08-18 DIAGNOSIS — E66811 Obesity, class 1: Secondary | ICD-10-CM

## 2024-08-18 DIAGNOSIS — E6609 Other obesity due to excess calories: Secondary | ICD-10-CM | POA: Diagnosis not present

## 2024-08-18 DIAGNOSIS — Z6834 Body mass index (BMI) 34.0-34.9, adult: Secondary | ICD-10-CM

## 2024-08-18 DIAGNOSIS — Z0130 Encounter for examination of blood pressure without abnormal findings: Secondary | ICD-10-CM

## 2024-08-18 DIAGNOSIS — F17210 Nicotine dependence, cigarettes, uncomplicated: Secondary | ICD-10-CM | POA: Diagnosis not present

## 2024-08-18 DIAGNOSIS — Z713 Dietary counseling and surveillance: Secondary | ICD-10-CM

## 2024-08-18 MED ORDER — WEGOVY 1.5 MG PO TABS: 1.5000 mg | ORAL_TABLET | Freq: Every day | ORAL | 0 refills | Status: AC

## 2024-08-18 NOTE — Progress Notes (Signed)
 "  Established Patient Office Visit  Subjective:  Patient ID: Cheyenne Hill, female    DOB: 25-Aug-1970  Age: 54 y.o. MRN: [id number]  Chief Complaint  Patient presents with   Acute Visit    PA for Wegovy     Patient in office for PA on Wegovy . Patient's insurance no longer paying for injectable Wegovy . Will send in oral Wegovy . Patient also interested in trying phentermine along with Wegovy . Will address at next visit.  Fasting lab work prior to next visit.  Return in 4 weeks for re-assessment.     No other concerns at this time.   Past Medical History:  Diagnosis Date   Palpitations    SBO (small bowel obstruction) (HCC) 05/25/2020    Past Surgical History:  Procedure Laterality Date   ABDOMINAL HYSTERECTOMY      Social History   Socioeconomic History   Marital status: Married    Spouse name: Not on file   Number of children: Not on file   Years of education: Not on file   Highest education level: Not on file  Occupational History   Not on file  Tobacco Use   Smoking status: Every Day   Smokeless tobacco: Never  Substance and Sexual Activity   Alcohol use: Yes    Comment: social   Drug use: Not Currently   Sexual activity: Not on file  Other Topics Concern   Not on file  Social History Narrative   Not on file   Social Drivers of Health   Tobacco Use: High Risk (08/18/2024)   Patient History    Smoking Tobacco Use: Every Day    Smokeless Tobacco Use: Never    Passive Exposure: Not on file  Financial Resource Strain: Not on file  Food Insecurity: Not on file  Transportation Needs: Not on file  Physical Activity: Not on file  Stress: Not on file  Social Connections: Not on file  Intimate Partner Violence: Not on file  Depression (PHQ2-9): High Risk (10/29/2023)   Depression (PHQ2-9)    PHQ-2 Score: 15  Alcohol Screen: Not on file  Housing: Not on file  Utilities: Not on file  Health Literacy: Not on file    Family History  Problem Relation Age  of Onset   Hypertension Mother     Allergies[1]  Show/hide medication list[2]  Review of Systems  Constitutional: Negative.   HENT: Negative.    Eyes: Negative.   Respiratory: Negative.  Negative for shortness of breath.   Cardiovascular: Negative.  Negative for chest pain.  Gastrointestinal: Negative.  Negative for abdominal pain, constipation and diarrhea.  Genitourinary: Negative.   Musculoskeletal:  Negative for joint pain and myalgias.  Skin: Negative.   Neurological: Negative.  Negative for dizziness and headaches.  Endo/Heme/Allergies: Negative.   All other systems reviewed and are negative.      Objective:   BP 122/82   Pulse 86   Ht 5' 10 (1.778 m)   Wt 240 lb (108.9 kg)   SpO2 95%   BMI 34.44 kg/m   Vitals:   08/18/24 0927  BP: 122/82  Pulse: 86  Height: 5' 10 (1.778 m)  Weight: 240 lb (108.9 kg)  SpO2: 95%  BMI (Calculated): 34.44    Physical Exam Vitals and nursing note reviewed.  Constitutional:      Appearance: Normal appearance. She is normal weight.  HENT:     Head: Normocephalic and atraumatic.     Nose: Nose normal.     Mouth/Throat:  Mouth: Mucous membranes are moist.  Eyes:     Extraocular Movements: Extraocular movements intact.     Conjunctiva/sclera: Conjunctivae normal.     Pupils: Pupils are equal, round, and reactive to light.  Cardiovascular:     Rate and Rhythm: Normal rate and regular rhythm.     Pulses: Normal pulses.     Heart sounds: Normal heart sounds.  Pulmonary:     Effort: Pulmonary effort is normal.     Breath sounds: Normal breath sounds.  Abdominal:     General: Abdomen is flat. Bowel sounds are normal.     Palpations: Abdomen is soft.  Musculoskeletal:        General: Normal range of motion.     Cervical back: Normal range of motion.  Skin:    General: Skin is warm and dry.  Neurological:     General: No focal deficit present.     Mental Status: She is alert and oriented to person, place, and  time.  Psychiatric:        Mood and Affect: Mood normal.        Behavior: Behavior normal.        Thought Content: Thought content normal.        Judgment: Judgment normal.      No results found for any visits on 08/18/24.  No results found for this or any previous visit (from the past 2160 hours).    Assessment & Plan:  Wegovy  oral Fasting lab work prior to next visit  Problem List Items Addressed This Visit       Other   Nicotine dependence   Weight loss counseling, encounter for - Primary   Class 1 obesity due to excess calories without serious comorbidity with body mass index (BMI) of 34.0 to 34.9 in adult   Relevant Medications   semaglutide -weight management (WEGOVY ) 1.5 MG tablet    Return in about 4 weeks (around 09/15/2024).   Total time spent: 25 minutes. This time includes review of previous notes and results and patient face to face interaction during today's visit.    Jeoffrey Pollen, NP  08/18/2024   This document may have been prepared by [facility] Voice Recognition software and as such may include unintentional dictation errors.     [1] No Known Allergies [2]  Outpatient Medications Prior to Visit  Medication Sig   hydrOXYzine  (ATARAX ) 10 MG tablet Take 1 tablet (10 mg total) by mouth 3 (three) times daily as needed.   [DISCONTINUED] Semaglutide -Weight Management (WEGOVY ) 1 MG/0.5ML SOAJ Inject 1 mg into the skin every 7 (seven) days.   Vitamin D , Ergocalciferol , (DRISDOL ) 1.25 MG (50000 UNIT) CAPS capsule Take 1 capsule (50,000 Units total) by mouth every 7 (seven) days.   No facility-administered medications prior to visit.   "

## 2024-08-30 ENCOUNTER — Ambulatory Visit: Admitting: Cardiology

## 2024-09-15 ENCOUNTER — Ambulatory Visit: Admitting: Cardiology
